# Patient Record
Sex: Male | Born: 1935 | Race: White | Hispanic: No | Smoking: Never smoker
Health system: Southern US, Community
[De-identification: ages and names within clinical notes are randomized; demographics above are authoritative.]

## PROBLEM LIST (undated history)

## (undated) DIAGNOSIS — I10 Essential (primary) hypertension: Secondary | ICD-10-CM

## (undated) DIAGNOSIS — M069 Rheumatoid arthritis, unspecified: Secondary | ICD-10-CM

## (undated) DIAGNOSIS — F0390 Unspecified dementia without behavioral disturbance: Secondary | ICD-10-CM

## (undated) DIAGNOSIS — G309 Alzheimer's disease, unspecified: Secondary | ICD-10-CM

## (undated) DIAGNOSIS — F0280 Dementia in other diseases classified elsewhere without behavioral disturbance: Secondary | ICD-10-CM

## (undated) DIAGNOSIS — I2699 Other pulmonary embolism without acute cor pulmonale: Secondary | ICD-10-CM

## (undated) DIAGNOSIS — F325 Major depressive disorder, single episode, in full remission: Secondary | ICD-10-CM

## (undated) HISTORY — DX: Essential (primary) hypertension: I10

## (undated) HISTORY — DX: Major depressive disorder, single episode, in full remission: F32.5

## (undated) HISTORY — DX: Unspecified dementia without behavioral disturbance: F03.90

## (undated) HISTORY — DX: Other pulmonary embolism without acute cor pulmonale: I26.99

## (undated) HISTORY — PX: JOINT REPLACEMENT: SHX530

---

## 2016-09-12 DEATH — deceased

## 2016-09-19 DIAGNOSIS — W19XXXA Unspecified fall, initial encounter: Secondary | ICD-10-CM

## 2016-09-19 DIAGNOSIS — I10 Essential (primary) hypertension: Secondary | ICD-10-CM

## 2016-09-19 DIAGNOSIS — F0390 Unspecified dementia without behavioral disturbance: Secondary | ICD-10-CM

## 2016-09-19 DIAGNOSIS — S72142A Displaced intertrochanteric fracture of left femur, initial encounter for closed fracture: Secondary | ICD-10-CM | POA: Diagnosis not present

## 2016-09-20 DIAGNOSIS — I10 Essential (primary) hypertension: Secondary | ICD-10-CM | POA: Diagnosis not present

## 2016-09-20 DIAGNOSIS — S72142A Displaced intertrochanteric fracture of left femur, initial encounter for closed fracture: Secondary | ICD-10-CM | POA: Diagnosis not present

## 2016-09-20 DIAGNOSIS — F0390 Unspecified dementia without behavioral disturbance: Secondary | ICD-10-CM | POA: Diagnosis not present

## 2016-09-20 DIAGNOSIS — W19XXXA Unspecified fall, initial encounter: Secondary | ICD-10-CM | POA: Diagnosis not present

## 2016-09-21 DIAGNOSIS — I10 Essential (primary) hypertension: Secondary | ICD-10-CM | POA: Diagnosis not present

## 2016-09-21 DIAGNOSIS — S72142A Displaced intertrochanteric fracture of left femur, initial encounter for closed fracture: Secondary | ICD-10-CM | POA: Diagnosis not present

## 2016-09-21 DIAGNOSIS — W19XXXA Unspecified fall, initial encounter: Secondary | ICD-10-CM | POA: Diagnosis not present

## 2016-09-21 DIAGNOSIS — F0390 Unspecified dementia without behavioral disturbance: Secondary | ICD-10-CM | POA: Diagnosis not present

## 2016-09-22 DIAGNOSIS — S72142A Displaced intertrochanteric fracture of left femur, initial encounter for closed fracture: Secondary | ICD-10-CM | POA: Diagnosis not present

## 2016-09-22 DIAGNOSIS — W19XXXA Unspecified fall, initial encounter: Secondary | ICD-10-CM

## 2016-09-22 DIAGNOSIS — F0390 Unspecified dementia without behavioral disturbance: Secondary | ICD-10-CM

## 2016-09-22 DIAGNOSIS — I10 Essential (primary) hypertension: Secondary | ICD-10-CM

## 2016-10-30 ENCOUNTER — Inpatient Hospital Stay (HOSPITAL_COMMUNITY)
Admission: EM | Admit: 2016-10-30 | Discharge: 2016-11-02 | DRG: 176 | Disposition: A | Discharge status: Skilled Nursing Facility | Payer: Medicare Other | Attending: Internal Medicine | Admitting: Internal Medicine

## 2016-10-30 ENCOUNTER — Emergency Department (HOSPITAL_COMMUNITY): Payer: Medicare Other

## 2016-10-30 ENCOUNTER — Encounter (HOSPITAL_COMMUNITY): Payer: Self-pay | Admitting: Emergency Medicine

## 2016-10-30 DIAGNOSIS — M069 Rheumatoid arthritis, unspecified: Secondary | ICD-10-CM | POA: Diagnosis present

## 2016-10-30 DIAGNOSIS — I2510 Atherosclerotic heart disease of native coronary artery without angina pectoris: Secondary | ICD-10-CM | POA: Diagnosis present

## 2016-10-30 DIAGNOSIS — I2699 Other pulmonary embolism without acute cor pulmonale: Principal | ICD-10-CM | POA: Diagnosis present

## 2016-10-30 DIAGNOSIS — R918 Other nonspecific abnormal finding of lung field: Secondary | ICD-10-CM | POA: Diagnosis present

## 2016-10-30 DIAGNOSIS — D696 Thrombocytopenia, unspecified: Secondary | ICD-10-CM | POA: Diagnosis present

## 2016-10-30 DIAGNOSIS — R0602 Shortness of breath: Secondary | ICD-10-CM | POA: Diagnosis present

## 2016-10-30 DIAGNOSIS — R296 Repeated falls: Secondary | ICD-10-CM | POA: Diagnosis present

## 2016-10-30 DIAGNOSIS — F0280 Dementia in other diseases classified elsewhere without behavioral disturbance: Secondary | ICD-10-CM | POA: Diagnosis present

## 2016-10-30 DIAGNOSIS — Z9181 History of falling: Secondary | ICD-10-CM

## 2016-10-30 DIAGNOSIS — I2609 Other pulmonary embolism with acute cor pulmonale: Secondary | ICD-10-CM

## 2016-10-30 DIAGNOSIS — Z96642 Presence of left artificial hip joint: Secondary | ICD-10-CM | POA: Diagnosis present

## 2016-10-30 DIAGNOSIS — R6510 Systemic inflammatory response syndrome (SIRS) of non-infectious origin without acute organ dysfunction: Secondary | ICD-10-CM | POA: Diagnosis present

## 2016-10-30 DIAGNOSIS — E872 Acidosis: Secondary | ICD-10-CM | POA: Diagnosis present

## 2016-10-30 DIAGNOSIS — Z7901 Long term (current) use of anticoagulants: Secondary | ICD-10-CM

## 2016-10-30 DIAGNOSIS — G309 Alzheimer's disease, unspecified: Secondary | ICD-10-CM | POA: Diagnosis not present

## 2016-10-30 DIAGNOSIS — Z66 Do not resuscitate: Secondary | ICD-10-CM | POA: Diagnosis present

## 2016-10-30 DIAGNOSIS — I10 Essential (primary) hypertension: Secondary | ICD-10-CM | POA: Diagnosis not present

## 2016-10-30 DIAGNOSIS — I2782 Chronic pulmonary embolism: Secondary | ICD-10-CM | POA: Diagnosis present

## 2016-10-30 HISTORY — DX: Alzheimer's disease, unspecified: G30.9

## 2016-10-30 HISTORY — DX: Essential (primary) hypertension: I10

## 2016-10-30 HISTORY — DX: Rheumatoid arthritis, unspecified: M06.9

## 2016-10-30 HISTORY — DX: Dementia in other diseases classified elsewhere, unspecified severity, without behavioral disturbance, psychotic disturbance, mood disturbance, and anxiety: F02.80

## 2016-10-30 LAB — CBC WITH DIFFERENTIAL/PLATELET
BASOS ABS: 0 10*3/uL (ref 0.0–0.1)
BASOS PCT: 0 %
EOS ABS: 0.1 10*3/uL (ref 0.0–0.7)
EOS PCT: 1 %
HEMATOCRIT: 44.7 % (ref 39.0–52.0)
Hemoglobin: 14.6 g/dL (ref 13.0–17.0)
Lymphocytes Relative: 6 %
Lymphs Abs: 0.8 10*3/uL (ref 0.7–4.0)
MCH: 28.5 pg (ref 26.0–34.0)
MCHC: 32.7 g/dL (ref 30.0–36.0)
MCV: 87.1 fL (ref 78.0–100.0)
MONO ABS: 0.8 10*3/uL (ref 0.1–1.0)
Monocytes Relative: 6 %
NEUTROS ABS: 11.1 10*3/uL — AB (ref 1.7–7.7)
Neutrophils Relative %: 87 %
PLATELETS: 166 10*3/uL (ref 150–400)
RBC: 5.13 MIL/uL (ref 4.22–5.81)
RDW: 14.7 % (ref 11.5–15.5)
WBC: 12.8 10*3/uL — ABNORMAL HIGH (ref 4.0–10.5)

## 2016-10-30 LAB — APTT: APTT: 91 s — AB (ref 24–36)

## 2016-10-30 LAB — I-STAT TROPONIN, ED: TROPONIN I, POC: 0.73 ng/mL — AB (ref 0.00–0.08)

## 2016-10-30 LAB — COMPREHENSIVE METABOLIC PANEL
ALBUMIN: 3.6 g/dL (ref 3.5–5.0)
ALT: 47 U/L (ref 17–63)
ANION GAP: 10 (ref 5–15)
AST: 30 U/L (ref 15–41)
Alkaline Phosphatase: 115 U/L (ref 38–126)
BUN: 17 mg/dL (ref 6–20)
CHLORIDE: 102 mmol/L (ref 101–111)
CO2: 24 mmol/L (ref 22–32)
Calcium: 9 mg/dL (ref 8.9–10.3)
Creatinine, Ser: 0.92 mg/dL (ref 0.61–1.24)
GFR calc Af Amer: >60 mL/min (ref >60)
GFR calc non Af Amer: >60 mL/min (ref >60)
GLUCOSE: 167 mg/dL — AB (ref 65–99)
POTASSIUM: 4.1 mmol/L (ref 3.5–5.1)
SODIUM: 136 mmol/L (ref 135–145)
Total Bilirubin: 0.7 mg/dL (ref 0.3–1.2)
Total Protein: 7.1 g/dL (ref 6.5–8.1)

## 2016-10-30 LAB — URINALYSIS, ROUTINE W REFLEX MICROSCOPIC
Bilirubin Urine: NEGATIVE
Glucose, UA: NEGATIVE mg/dL
HGB URINE DIPSTICK: NEGATIVE
Ketones, ur: NEGATIVE mg/dL
LEUKOCYTES UA: NEGATIVE
Nitrite: NEGATIVE
Protein, ur: NEGATIVE mg/dL
SPECIFIC GRAVITY, URINE: 1.011 (ref 1.005–1.030)
pH: 5.5 (ref 5.0–8.0)

## 2016-10-30 LAB — I-STAT CG4 LACTIC ACID, ED
LACTIC ACID, VENOUS: 2.31 mmol/L — AB (ref 0.5–1.9)
Lactic Acid, Venous: 1.25 mmol/L (ref 0.5–1.9)

## 2016-10-30 LAB — MRSA PCR SCREENING: MRSA by PCR: NEGATIVE

## 2016-10-30 LAB — TROPONIN I: TROPONIN I: 1.26 ng/mL — AB (ref <0.03)

## 2016-10-30 LAB — BRAIN NATRIURETIC PEPTIDE: B Natriuretic Peptide: 20.2 pg/mL (ref 0.0–100.0)

## 2016-10-30 MED ORDER — SODIUM CHLORIDE 0.9% FLUSH
3.0000 mL | Freq: Two times a day (BID) | INTRAVENOUS | Status: DC
  Administered 2016-10-31 – 2016-11-01 (×2): 3 mL via INTRAVENOUS

## 2016-10-30 MED ORDER — HEPARIN BOLUS VIA INFUSION
2000.0000 [IU] | Freq: Once | INTRAVENOUS | Status: AC
  Administered 2016-10-30: 2000 [IU] via INTRAVENOUS
  Filled 2016-10-30: qty 2000

## 2016-10-30 MED ORDER — IOPAMIDOL (ISOVUE-370) INJECTION 76%
INTRAVENOUS | Status: AC
  Filled 2016-10-30: qty 100

## 2016-10-30 MED ORDER — DONEPEZIL HCL 10 MG PO TABS
10.0000 mg | ORAL_TABLET | Freq: Every day | ORAL | Status: DC
  Administered 2016-10-31 – 2016-11-02 (×3): 10 mg via ORAL
  Filled 2016-10-30 (×3): qty 1

## 2016-10-30 MED ORDER — FOLIC ACID 1 MG PO TABS
1.0000 mg | ORAL_TABLET | Freq: Every day | ORAL | Status: DC
  Administered 2016-10-31 – 2016-11-02 (×3): 1 mg via ORAL
  Filled 2016-10-30 (×3): qty 1

## 2016-10-30 MED ORDER — CALCIUM CARBONATE-VITAMIN D 500-200 MG-UNIT PO TABS
1.0000 | ORAL_TABLET | Freq: Two times a day (BID) | ORAL | Status: DC
  Administered 2016-10-31 – 2016-11-02 (×5): 1 via ORAL
  Filled 2016-10-30 (×5): qty 1

## 2016-10-30 MED ORDER — SODIUM CHLORIDE 0.9 % IV BOLUS (SEPSIS)
500.0000 mL | Freq: Once | INTRAVENOUS | Status: AC
  Administered 2016-10-30: 500 mL via INTRAVENOUS

## 2016-10-30 MED ORDER — CITALOPRAM HYDROBROMIDE 20 MG PO TABS
10.0000 mg | ORAL_TABLET | Freq: Every day | ORAL | Status: DC
  Administered 2016-10-31 – 2016-11-02 (×3): 10 mg via ORAL
  Filled 2016-10-30 (×3): qty 1

## 2016-10-30 MED ORDER — ORAL CARE MOUTH RINSE
15.0000 mL | Freq: Two times a day (BID) | OROMUCOSAL | Status: DC
  Administered 2016-10-31 – 2016-11-02 (×5): 15 mL via OROMUCOSAL

## 2016-10-30 MED ORDER — METHOTREXATE 2.5 MG PO TABS
12.5000 mg | ORAL_TABLET | ORAL | Status: DC
  Administered 2016-11-02: 12.5 mg via ORAL
  Filled 2016-10-30 (×2): qty 5

## 2016-10-30 MED ORDER — METHOTREXATE 2.5 MG PO TABS: 12.5000 mg | ORAL_TABLET | ORAL | Status: DC

## 2016-10-30 MED ORDER — HEPARIN (PORCINE) IN NACL 100-0.45 UNIT/ML-% IJ SOLN
1200.0000 [IU]/h | INTRAMUSCULAR | Status: DC
  Administered 2016-10-30: 1200 [IU]/h via INTRAVENOUS
  Filled 2016-10-30 (×2): qty 250

## 2016-10-30 NOTE — Consult Note (Signed)
Name: Joseph [name] MRN: [id number] DOB: 1929/03/19    ADMISSION DATE:  10/30/2016 CONSULTATION DATE:  10/30/2016  REFERRING MD :  Julian Reil  CHIEF COMPLAINT:  Pulmonary embolism  BRIEF PATIENT DESCRIPTION: 45M with hx of dementia, HTN and RA presents with pulmonary embolism, mild troponin leak, adequate saturations on 2L [place], concern for RH strain by CT criteria.   SIGNIFICANT EVENTS   STUDIES:  CT PE 10/30/16 IMPRESSION: 1. Positive for acute PE with CT evidence of right heart strain (RV/LV Ratio = 1.9) consistent with at least submassive (intermediate risk) PE. The presence of right heart strain has been associated with an increased risk of morbidity and mortality. Please activate Code PE by paging [phone]. Critical Value/emergent results were called by telephone at the time of interpretation on 10/30/2016 at 6:05 pm to Dr. Crista Curb , who verbally acknowledged these results. 2. Aortic atherosclerosis (ICD10-170.0). Coronary artery calcification. 3. Scattered pulmonary nodules measure 4 mm or less in size. No follow-up needed if patient is low-risk (and has no known or suspected primary neoplasm). Non-contrast chest C in T can be considered in 12 months if patient is high-risk.  CXR 10/30/16 IMPRESSION: Stable chronic elevation of left hemidiaphragm. No active lung disease. Aortic atherosclerosis.   HISTORY OF PRESENT ILLNESS:   Joseph [name] is an 44M with PMH significant for dementia (moderately advanced), hypertension and rheumatoid arthritis, who presents from home via EMS with complaints of shortness of breath. He had a recent hip fracture (09/19/16), with markedly decreased mobility for the past several weeks. He was transitioning from wheelchair to walker and became acutely short of breath. His daughter had him sit back down to catch his breath. When she went to check on him a bit later, he was still very short of breath and said he needed to use the bathroom. He  continued to be short of breath and reportedly got pale while EMS was being called. Imaging in the ED revealed bilateral pulmonary emboli in the distal right and left main pulmonary arteries. RV/LV ratio 1.9 with mild troponin leak, normal BNP and no reports of hypotension, syncope or near-syncope. The patient was started on a heparin gtt and admitted to stepdown. His daughter provides most of the history. He does not feel short of breath while at rest and is lying comfortably in the bed. No chest pain with deep inspiration. No palpitations. No cough / sputum / hemoptysis. No nausea or vomiting. No lower extremity edema or asymmetry to speak of. ROS otherwise difficult to obtain 2/2 hx of dementia.   PAST MEDICAL HISTORY :   has a past medical history of Alzheimer's dementia; Hypertension; and RA (rheumatoid arthritis) (HCC).  has a past surgical history that includes Joint replacement. Prior to Admission medications   Medication Sig Start Date End Date Taking? Authorizing Provider  calcium-vitamin D (OSCAL WITH D) 500-200 MG-UNIT tablet Take 1 tablet by mouth 2 (two) times daily.   Yes Historical Provider, MD  citalopram (CELEXA) 10 MG tablet Take 10 mg by mouth daily.   Yes Historical Provider, MD  cyanocobalamin 1000 MCG tablet Take 1,000 mcg by mouth daily.   Yes Historical Provider, MD  donepezil (ARICEPT) 10 MG tablet Take 10 mg by mouth daily.   Yes Historical Provider, MD  folic acid (FOLVITE) 1 MG tablet Take 1 mg by mouth daily.   Yes Historical Provider, MD  losartan (COZAAR) 100 MG tablet Take 100 mg by mouth daily.   Yes Historical Provider, MD  methotrexate (  RHEUMATREX) 2.5 MG tablet Take 12.5 mg by mouth once a week. Caution:Chemotherapy. Protect from light.  Pt takes on Saturday evenings   Yes Historical Provider, MD   Allergies  Allergen Reactions  . Penicillins Other (See Comments)    Family thinks pt is allergic to penicillin but not sure     FAMILY HISTORY:  family history  includes Other in his father. SOCIAL HISTORY:  reports that he has never smoked. He has never used smokeless tobacco. He reports that he does not drink alcohol or use drugs.  REVIEW OF SYSTEMS:   Difficult to obtain secondary to dementia history; pertinent positives and negatives as reported in the HPI  SUBJECTIVE:   VITAL SIGNS: Temp:  [97.7 F (36.5 C)] 97.7 F (36.5 C) (11/18 1456) Pulse Rate:  [110-115] 110 (11/18 2030) Resp:  [16-21] 19 (11/18 2030) BP: (123-135)/(84-89) 127/88 (11/18 2030) SpO2:  [95 %-100 %] 98 % (11/18 2030) Weight:  [77.1 kg (170 lb)] 77.1 kg (170 lb) (11/18 1513)  PHYSICAL EXAMINATION:  General Well nourished, well developed, no apparent distress, resting comfortably  HEENT No gross abnormalities. Oropharynx clear.   Pulmonary Clear to auscultation bilaterally with no wheezes, rales or ronchi. Good effort, symmetrical expansion.   Cardiovascular Mild tachycardia 100s, regular rhythm. S1, s2. No m/r/g. Distal pulses palpable.  Abdomen Soft, non-tender, non-distended, positive bowel sounds, no palpable organomegaly or masses. Normoresonant to percussion.  Musculoskeletal Grossly normal  Lymphatics No cervical, supraclavicular or axillary adenopathy.   Neurologic Grossly intact. No focal deficits.   Skin/Integuement No rash, no cyanosis, no clubbing.       Recent Labs Lab 10/30/16 1504  NA 136  K 4.1  CL 102  CO2 24  BUN 17  CREATININE 0.92  GLUCOSE 167*    Recent Labs Lab 10/30/16 1504  HGB 14.6  HCT 44.7  WBC 12.8*  PLT 166   Dg Chest 2 View  Result Date: 10/30/2016 CLINICAL DATA:  Acute onset of shortness of breath approximately 1 hour ago. EXAM: CHEST  2 VIEW COMPARISON:  10/15/2016 FINDINGS: Heart size is within normal limits.  Aortic atherosclerosis. Chronic elevation of left hemidiaphragm is stable. Both lungs are clear. No evidence of pneumothorax or pleural effusion. IMPRESSION: Stable chronic elevation of left hemidiaphragm.  No active lung disease. Aortic atherosclerosis. Electronically Signed   By: Myles Rosenthal M.D.   On: 10/30/2016 15:52   Ct Angio Chest Pe W And/or Wo Contrast  Result Date: 10/30/2016 CLINICAL DATA:  Sub onset shortness of breath. EXAM: CT ANGIOGRAPHY CHEST WITH CONTRAST TECHNIQUE: Multidetector CT imaging of the chest was performed using the standard protocol during bolus administration of intravenous contrast. Multiplanar CT image reconstructions and MIPs were obtained to evaluate the vascular anatomy. CONTRAST:  100 cc Isovue 370. COMPARISON:  None. FINDINGS: Cardiovascular: There are filling defects in the pulmonary arteries bilaterally with the most proximal clot is seen in the distal right and left main pulmonary arteries. RV/LV ratio is 1.9. Atherosclerotic calcification of the arterial vasculature, including coronary arteries. Heart size normal. No pericardial effusion. Mediastinum/Nodes: No pathologically enlarged mediastinal, hilar or axillary lymph nodes. Esophagus is grossly unremarkable. Lungs/Pleura: Image quality is degraded by respiratory motion. A few scattered pulmonary nodules measure 4 mm or less in size. Mild volume loss of the base of the left hemi thorax, adjacent to an elevated left hemidiaphragm. No pleural fluid. Airway is unremarkable. Upper Abdomen: Sub cm low-attenuation lesion in the left hepatic lobe is too small to characterize. Visualized portions  of the liver, gallbladder, adrenal glands, left kidney, spleen, pancreas, stomach and bowel are otherwise grossly unremarkable. Musculoskeletal: No worrisome lytic or sclerotic lesions. Degenerative changes are seen in the spine. Review of the MIP images confirms the above findings. IMPRESSION: 1. Positive for acute PE with CT evidence of right heart strain (RV/LV Ratio = 1.9) consistent with at least submassive (intermediate risk) PE. The presence of right heart strain has been associated with an increased risk of morbidity and  mortality. Please activate Code PE by paging [phone]. Critical Value/emergent results were called by telephone at the time of interpretation on 10/30/2016 at 6:05 pm to Dr. Crista Curb , who verbally acknowledged these results. 2. Aortic atherosclerosis (ICD10-170.0). Coronary artery calcification. 3. Scattered pulmonary nodules measure 4 mm or less in size. No follow-up needed if patient is low-risk (and has no known or suspected primary neoplasm). Non-contrast chest C in T can be considered in 12 months if patient is high-risk. This recommendation follows the consensus statement: Guidelines for Management of Incidental Pulmonary Nodules Detected on CT Images: From the Fleischner Society 2017; Radiology 2017; 284:228-243. Electronically Signed   By: Leanna Battles M.D.   On: 10/30/2016 18:10    ASSESSMENT / PLAN:  Joseph [name] is an 17M with what appears to be a provoked DVT/PE related to recent hip surgery and fall. He has no history of clotting disorders and no other identifiable risk factors (no history of malignancy). While his RV/LV ratio was increased on his initial CT scan, his clinical picture with lack of hypotension/syncope, normal BNP, mild tachycardia and minimal oxygen requirements suggests he has not truly suffered a submassive PE. Given his age and dementia, I would not recommend TPA or directed thrombolysis. Continue anticoagulation. He is likely a better candidate for heparin/LMWH -> warfarin than a NOAC given his fall risk. O2 should be weaned to maintain sats >90%. He would benefit from early PT. He should have a walk for desat prior to discharge.   1) Acute bilateral pulmonary emboli   continue anticoagulation with heparin and transition to oral agent as able  should he deteriorate clinically, could discuss catheter directed thrombolysis, although he is high risk for any tPA exposure  Wean O2 as tolerated for sats >90%  Walk for desat prior to discharge  2) Code  status  During my interview, his daughter produced a portable DNR; this order has been placed.  Thank you for the consult.   Nita Sickle, MD Pulmonary and Critical Care Medicine [facility] Pager: [phone]  10/30/2016, 8:52 PM

## 2016-10-30 NOTE — ED Notes (Addendum)
Hospitalist Gardner,MD. at bedside.

## 2016-10-30 NOTE — ED Provider Notes (Signed)
[facility]-EMERGENCY DEPT Provider Note   CSN: [id number] Arrival date & time: 10/30/16  1440     History   Chief Complaint Chief Complaint  Patient presents with  . Shortness of Breath    HPI Joseph [name] is a 82 y.o. male.  HPI Level V caveat due to dementia. He has history of Alzheimer's dementia, HTN, and rheumatoid arthritis. Per EMS, called to home where he lives to family. He had 2 episodes of dyspnea, lasting 3-5 minutes, and subsequently resolved. In ED, denies chest pain, dyspnea, cough.   Per daughter who lives with patient at home, he had repair of left hip fracture in October of this year and has been less mobile. States that he has been in his usual state of health up until today. While transferring from chair to wheelchair today to go to the bathroom he appeared very winded and complained of shortness this of breath. Resolved after a few minutes. Similarly while sitting on the toilet started breathing heavily for several minutes, and then had resolution of symptoms. EMS was subsequently called and when patient transferred from wheelchair to the gurney, he again looked very short of breath and winded. He has not had fevers, cough complaints of chest pain, lower extremity edema, nausea or vomiting, or diarrhea.  Past Medical History:  Diagnosis Date  . Alzheimer's dementia   . Hypertension   . RA (rheumatoid arthritis) [facility](HCC)     Patient Active Problem List   Diagnosis Date Noted  . Pulmonary embolism (HCC) 10/30/2016  . Pulmonary nodules 10/30/2016  . HTN (hypertension) 10/30/2016  . Alzheimer's dementia 10/30/2016  . RA (rheumatoid arthritis) (HCC) 10/30/2016    Past Surgical History:  Procedure Laterality Date  . JOINT REPLACEMENT     left hip replacement 09-19-16       Home Medications    Prior to Admission medications   Medication Sig Start Date End Date Taking? Authorizing Provider  calcium-vitamin D (OSCAL WITH D) 500-200 MG-UNIT tablet Take 1 tablet  by mouth 2 (two) times daily.   Yes Historical Provider, MD  citalopram (CELEXA) 10 MG tablet Take 10 mg by mouth daily.   Yes Historical Provider, MD  cyanocobalamin 1000 MCG tablet Take 1,000 mcg by mouth daily.   Yes Historical Provider, MD  donepezil (ARICEPT) 10 MG tablet Take 10 mg by mouth daily.   Yes Historical Provider, MD  folic acid (FOLVITE) 1 MG tablet Take 1 mg by mouth daily.   Yes Historical Provider, MD  losartan (COZAAR) 100 MG tablet Take 100 mg by mouth daily.   Yes Historical Provider, MD  methotrexate (RHEUMATREX) 2.5 MG tablet Take 12.5 mg by mouth once a week. Caution:Chemotherapy. Protect from light.  Pt takes on Saturday evenings   Yes Historical Provider, MD    Family History Family History  Problem Relation Age of Onset  . Other Father     On coumadin    Social History Social History  Substance Use Topics  . Smoking status: Never Smoker  . Smokeless tobacco: Never Used  . Alcohol use No     Allergies   Penicillins   Review of Systems Review of Systems Unable to be obtained due to dementia  Physical Exam Updated Vital Signs BP 127/88   Pulse 110   Temp 97.7 F (36.5 C) (Oral)   Resp 19   Ht 5\' 7"  (1.702 m)   Wt 170 lb (77.1 kg)   SpO2 98%   BMI 26.63 kg/m   Physical  Exam Physical Exam  Nursing note and vitals reviewed. Constitutional: Well developed, well nourished, non-toxic, and in no acute distress Head: Normocephalic and atraumatic.  Mouth/Throat: Oropharynx is clear and moist.  Neck: Normal range of motion. Neck supple.  Cardiovascular: tachycardic rate and regular rhythm.  No edema. Pulmonary/Chest: Effort normal and breath sounds normal.  Abdominal: Soft. There is no tenderness. There is no rebound and no guarding.  Musculoskeletal: Normal range of motion.  Neurological: Alert, oriented x 0, no facial droop, fluent speech, moves all extremities symmetrically Skin: Skin is warm and dry.  Psychiatric: Cooperative   ED  Treatments / Results  Labs (all labs ordered are listed, but only abnormal results are displayed) Labs Reviewed  CBC WITH DIFFERENTIAL/PLATELET - Abnormal; Notable for the following:       Result Value   WBC 12.8 (*)    Neutro Abs 11.1 (*)    All other components within normal limits  COMPREHENSIVE METABOLIC PANEL - Abnormal; Notable for the following:    Glucose, Bld 167 (*)    All other components within normal limits  APTT - Abnormal; Notable for the following:    aPTT 91 (*)    All other components within normal limits  I-STAT TROPOININ, ED - Abnormal; Notable for the following:    Troponin i, poc 0.73 (*)    All other components within normal limits  I-STAT CG4 LACTIC ACID, ED - Abnormal; Notable for the following:    Lactic Acid, Venous 2.31 (*)    All other components within normal limits  URINALYSIS, ROUTINE W REFLEX MICROSCOPIC (NOT AT [facility])  BRAIN NATRIURETIC PEPTIDE  HEPARIN LEVEL (UNFRACTIONATED)  CBC  BASIC METABOLIC PANEL  TROPONIN I  TROPONIN I  TROPONIN I  I-STAT CG4 LACTIC ACID, ED    EKG  EKG Interpretation  Date/Time:  Saturday October 30 2016 14:54:03 EST Ventricular Rate:  116 PR Interval:    QRS Duration: 90 QT Interval:  307 QTC Calculation: 427 R Axis:   76 Text Interpretation:  Sinus tachycardia Low voltage, precordial leads No prior EKG  Confirmed by Minh Roanhorse MD, Annabelle Harman ([id number]) on 10/30/2016 3:04:54 PM       Radiology Dg Chest 2 View  Result Date: 10/30/2016 CLINICAL DATA:  Acute onset of shortness of breath approximately 1 hour ago. EXAM: CHEST  2 VIEW COMPARISON:  10/15/2016 FINDINGS: Heart size is within normal limits.  Aortic atherosclerosis. Chronic elevation of left hemidiaphragm is stable. Both lungs are clear. No evidence of pneumothorax or pleural effusion. IMPRESSION: Stable chronic elevation of left hemidiaphragm. No active lung disease. Aortic atherosclerosis. Electronically Signed   By: Myles Rosenthal M.D.   On: 10/30/2016 15:52   Ct  Angio Chest Pe W And/or Wo Contrast  Result Date: 10/30/2016 CLINICAL DATA:  Sub onset shortness of breath. EXAM: CT ANGIOGRAPHY CHEST WITH CONTRAST TECHNIQUE: Multidetector CT imaging of the chest was performed using the standard protocol during bolus administration of intravenous contrast. Multiplanar CT image reconstructions and MIPs were obtained to evaluate the vascular anatomy. CONTRAST:  100 cc Isovue 370. COMPARISON:  None. FINDINGS: Cardiovascular: There are filling defects in the pulmonary arteries bilaterally with the most proximal clot is seen in the distal right and left main pulmonary arteries. RV/LV ratio is 1.9. Atherosclerotic calcification of the arterial vasculature, including coronary arteries. Heart size normal. No pericardial effusion. Mediastinum/Nodes: No pathologically enlarged mediastinal, hilar or axillary lymph nodes. Esophagus is grossly unremarkable. Lungs/Pleura: Image quality is degraded by respiratory motion. A few scattered pulmonary  nodules measure 4 mm or less in size. Mild volume loss of the base of the left hemi thorax, adjacent to an elevated left hemidiaphragm. No pleural fluid. Airway is unremarkable. Upper Abdomen: Sub cm low-attenuation lesion in the left hepatic lobe is too small to characterize. Visualized portions of the liver, gallbladder, adrenal glands, left kidney, spleen, pancreas, stomach and bowel are otherwise grossly unremarkable. Musculoskeletal: No worrisome lytic or sclerotic lesions. Degenerative changes are seen in the spine. Review of the MIP images confirms the above findings. IMPRESSION: 1. Positive for acute PE with CT evidence of right heart strain (RV/LV Ratio = 1.9) consistent with at least submassive (intermediate risk) PE. The presence of right heart strain has been associated with an increased risk of morbidity and mortality. Please activate Code PE by paging [phone]. Critical Value/emergent results were called by telephone at the time of  interpretation on 10/30/2016 at 6:05 pm to Dr. Crista Curb , who verbally acknowledged these results. 2. Aortic atherosclerosis (ICD10-170.0). Coronary artery calcification. 3. Scattered pulmonary nodules measure 4 mm or less in size. No follow-up needed if patient is low-risk (and has no known or suspected primary neoplasm). Non-contrast chest C in T can be considered in 12 months if patient is high-risk. This recommendation follows the consensus statement: Guidelines for Management of Incidental Pulmonary Nodules Detected on CT Images: From the Fleischner Society 2017; Radiology 2017; 284:228-243. Electronically Signed   By: Leanna Battles M.D.   On: 10/30/2016 18:10    Procedures Procedures (including critical care time) CRITICAL CARE Performed by: Lavera Guise   Total critical care time: 40 minutes  Critical care time was exclusive of separately billable procedures and treating other patients.  Critical care was necessary to treat or prevent imminent or life-threatening deterioration.  Critical care was time spent personally by me on the following activities: development of treatment plan with patient and/or surrogate as well as nursing, discussions with consultants, evaluation of patient's response to treatment, examination of patient, obtaining history from patient or surrogate, ordering and performing treatments and interventions, ordering and review of laboratory studies, ordering and review of radiographic studies, pulse oximetry and re-evaluation of patient's condition.  Medications Ordered in ED Medications  iopamidol (ISOVUE-370) 76 % injection (not administered)  heparin ADULT infusion 100 units/mL (25000 units/259mL sodium chloride 0.45%) (1,200 Units/hr Intravenous New Bag/Given 10/30/16 1845)  calcium-vitamin D (OSCAL WITH D) 500-200 MG-UNIT per tablet 1 tablet (not administered)  citalopram (CELEXA) tablet 10 mg (not administered)  donepezil (ARICEPT) tablet 10 mg (not  administered)  folic acid (FOLVITE) tablet 1 mg (not administered)  sodium chloride flush (NS) 0.9 % injection 3 mL (not administered)  methotrexate (RHEUMATREX) tablet 12.5 mg (not administered)  sodium chloride 0.9 % bolus 500 mL (0 mLs Intravenous Stopped 10/30/16 1720)  heparin bolus via infusion 2,000 Units (2,000 Units Intravenous Bolus from Bag 10/30/16 1845)     Initial Impression / Assessment and Plan / ED Course  I have reviewed the triage vital signs and the nursing notes.  Pertinent labs & imaging results that were available during my care of the patient were reviewed by me and considered in my medical decision making (see chart for details).  Clinical Course     History of also received dementia and recent left hip fracture with repair in October who presents with episodic shortness of breath over the course of the past day. On arrival he is tachycardic with heart rate in the 110s to 120s. He is normotensive, afebrile,  breathing comfortably with normal oxygenation. Chest x-ray visualized and shows no acute cardiopulmonary processes. He is noted to have troponin elevation of 0.7 and no complaints of chest pain and no acute ischemic changes noted on EKG. Concerned about PE, and underwent CT angiogram of the chest. This is visualized and reviewed with radiology. He has bilateral segmental PE causing right heart strain. I discussed with Dr. Levada Schilling from ICU who recommended hospitalist admission to stepdown unit with pulmonary critical care consultation. Subsequently discussed with Dr. Julian Reil who will admit to stepdown. In interim, patient started on heparin drip.    Final Clinical Impressions(s) / ED Diagnoses   Final diagnoses:  Other acute pulmonary embolism with acute cor pulmonale (HCC)    New Prescriptions New Prescriptions   No medications on file     Lavera Guise, MD 10/30/16 2116

## 2016-10-30 NOTE — ED Notes (Signed)
Sent add on label for PTT/BNP -  Scientific laboratory technician is locked so I am unable to click off labels and send them.  Per lab - send down sticker with chart label and hand write labs that were added on.

## 2016-10-30 NOTE — ED Notes (Signed)
Patient returned from X-ray 

## 2016-10-30 NOTE — Progress Notes (Signed)
ANTICOAGULATION CONSULT NOTE - Initial Consult  Pharmacy Consult for IV heparin Indication: pulmonary embolus  Allergies  Allergen Reactions  . Penicillins Other (See Comments)    Family thinks pt is allergic to penicillin but not sure     Patient Measurements: Height: 5\' 7"  (170.2 cm) Weight: 170 lb (77.1 kg) IBW/kg (Calculated) : 66.1 Heparin Dosing Weight: 77  Vital Signs: Temp: 97.7 F (36.5 C) (11/18 1456) Temp Source: Oral (11/18 1456) BP: 135/89 (11/18 1723) Pulse Rate: 115 (11/18 1723)  Labs:  Recent Labs  10/30/16 1504  HGB 14.6  HCT 44.7  PLT 166  CREATININE 0.92    Estimated Creatinine Clearance: 54.9 mL/min (by C-G formula based on SCr of 0.92 mg/dL).   Medical History: Past Medical History:  Diagnosis Date  . Arthritis   . Hypertension     Medications:  Scheduled:  . iopamidol       Infusions:    Assessment: 82 yo male presented to ER with shortness of breath found to have new PE to start IV heparin per pharmacy dosing. Baseline labs WNL.   Goal of Therapy:  Heparin level 0.3-0.7 units/ml Monitor platelets by anticoagulation protocol: Yes   Plan:  1) Start IV heparin 2000 unit bolus then  2) IV heparin rate of 1200 units/hr 3) Note that IV heparin is not treatment of choice for PE per guidelines - recommend switching as soon as possible 4) Check heparin level 8 hours after start of IV heparin 5) Daily heparin level and CBC   83, PharmD, BCPS Pager [phone] 10/30/2016 6:18 PM

## 2016-10-30 NOTE — ED Notes (Signed)
Patient transported to CT 

## 2016-10-30 NOTE — ED Triage Notes (Signed)
Pt from home via [facility]- Pt has sudden onset of SOB approx 1 hr PTA. Pt sts that he had another instance of SOB after calling EMS while using the restroom. While en route and upon arrival, pt sts he feels better and is no longer SOB. Pt is A&O and in NAD. Pt showed ST on monitor en route as well.

## 2016-10-30 NOTE — H&P (Signed)
History and Physical    Joseph [name] OFB:[id number] DOB: 1928-12-21 DOA: 10/30/2016   PCP: Charlott Rakes, MD Chief Complaint:  Chief Complaint  Patient presents with  . Shortness of Breath    HPI: Joseph [name] is a 82 y.o. male with medical history significant of RA, HTN, alzheimer's dementia.  Presents to ED today after family (daughter who is historian) noted 2 episodes of dyspnea that onset after he stood up this afternoon.  Each lasting 3-5 mins and subsequently resolved.  No cough, fever.  No similar symptoms previously.  Tried nothing for symptoms, nothing makes better or worse.  ED Course: Tachy to 110s, CTA PE study demonstrates PEs with R heart strain.  Trop 0.73.  Review of Systems: As per HPI otherwise 10 point review of systems negative.    Past Medical History:  Diagnosis Date  . Alzheimer's dementia   . Hypertension   . RA (rheumatoid arthritis) (HCC)     Past Surgical History:  Procedure Laterality Date  . JOINT REPLACEMENT     left hip replacement 09-19-16     reports that he has never smoked. He has never used smokeless tobacco. He reports that he does not drink alcohol or use drugs.  Allergies  Allergen Reactions  . Penicillins Other (See Comments)    Family thinks pt is allergic to penicillin but not sure     Family History  Problem Relation Age of Onset  . Other Father     On coumadin      Prior to Admission medications   Medication Sig Start Date End Date Taking? Authorizing Provider  calcium-vitamin D (OSCAL WITH D) 500-200 MG-UNIT tablet Take 1 tablet by mouth 2 (two) times daily.   Yes Historical Provider, MD  citalopram (CELEXA) 10 MG tablet Take 10 mg by mouth daily.   Yes Historical Provider, MD  cyanocobalamin 1000 MCG tablet Take 1,000 mcg by mouth daily.   Yes Historical Provider, MD  donepezil (ARICEPT) 10 MG tablet Take 10 mg by mouth daily.   Yes Historical Provider, MD  folic acid (FOLVITE) 1 MG tablet Take 1 mg by mouth daily.    Yes Historical Provider, MD  losartan (COZAAR) 100 MG tablet Take 100 mg by mouth daily.   Yes Historical Provider, MD  methotrexate (RHEUMATREX) 2.5 MG tablet Take 12.5 mg by mouth once a week. Caution:Chemotherapy. Protect from light.  Pt takes on Saturday evenings   Yes Historical Provider, MD    Physical Exam: Vitals:   10/30/16 1456 10/30/16 1513 10/30/16 1530 10/30/16 1723  BP: 134/84  133/86 135/89  Pulse: 115  114 115  Resp: 18  19 16   Temp: 97.7 F (36.5 C)     TempSrc: Oral     SpO2: 100% 97% 95% 95%  Weight:  77.1 kg (170 lb)    Height:  5\' 7"  (1.702 m)        Constitutional: NAD, calm, comfortable Eyes: PERRL, lids and conjunctivae normal ENMT: Mucous membranes are moist. Posterior pharynx clear of any exudate or lesions.Normal dentition.  Neck: normal, supple, no masses, no thyromegaly Respiratory: clear to auscultation bilaterally, no wheezing, no crackles. Normal respiratory effort. No accessory muscle use.  Cardiovascular: Regular rate and rhythm, no murmurs / rubs / gallops. No extremity edema. 2+ pedal pulses. No carotid bruits.  Abdomen: no tenderness, no masses palpated. No hepatosplenomegaly. Bowel sounds positive.  Musculoskeletal: no clubbing / cyanosis. No joint deformity upper and lower extremities. Good ROM, no contractures. Normal muscle tone.  Skin: Lesion on superior posterior aspect of R ear, suspicious for a small primary skin CA. Neurologic: CN 2-12 grossly intact. Sensation intact, DTR normal. Strength 5/5 in all 4.  Psychiatric: Normal judgment and insight. Alert and oriented x 3. Normal mood.    Labs on Admission: I have personally reviewed following labs and imaging studies  CBC:  Recent Labs Lab 10/30/16 1504  WBC 12.8*  NEUTROABS 11.1*  HGB 14.6  HCT 44.7  MCV 87.1  PLT 166   Basic Metabolic Panel:  Recent Labs Lab 10/30/16 1504  NA 136  K 4.1  CL 102  CO2 24  GLUCOSE 167*  BUN 17  CREATININE 0.92  CALCIUM 9.0    GFR: Estimated Creatinine Clearance: 54.9 mL/min (by C-G formula based on SCr of 0.92 mg/dL). Liver Function Tests:  Recent Labs Lab 10/30/16 1504  AST 30  ALT 47  ALKPHOS 115  BILITOT 0.7  PROT 7.1  ALBUMIN 3.6   No results for input(s): LIPASE, AMYLASE in the last 168 hours. No results for input(s): AMMONIA in the last 168 hours. Coagulation Profile: No results for input(s): INR, PROTIME in the last 168 hours. Cardiac Enzymes: No results for input(s): CKTOTAL, CKMB, CKMBINDEX, TROPONINI in the last 168 hours. BNP (last 3 results) No results for input(s): PROBNP in the last 8760 hours. HbA1C: No results for input(s): HGBA1C in the last 72 hours. CBG: No results for input(s): GLUCAP in the last 168 hours. Lipid Profile: No results for input(s): CHOL, HDL, LDLCALC, TRIG, CHOLHDL, LDLDIRECT in the last 72 hours. Thyroid Function Tests: No results for input(s): TSH, T4TOTAL, FREET4, T3FREE, THYROIDAB in the last 72 hours. Anemia Panel: No results for input(s): VITAMINB12, FOLATE, FERRITIN, TIBC, IRON, RETICCTPCT in the last 72 hours. Urine analysis:    Component Value Date/Time   COLORURINE YELLOW 10/30/2016 1630   APPEARANCEUR CLEAR 10/30/2016 1630   LABSPEC 1.011 10/30/2016 1630   PHURINE 5.5 10/30/2016 1630   GLUCOSEU NEGATIVE 10/30/2016 1630   HGBUR NEGATIVE 10/30/2016 1630   BILIRUBINUR NEGATIVE 10/30/2016 1630   KETONESUR NEGATIVE 10/30/2016 1630   PROTEINUR NEGATIVE 10/30/2016 1630   NITRITE NEGATIVE 10/30/2016 1630   LEUKOCYTESUR NEGATIVE 10/30/2016 1630   Sepsis Labs: @LABRCNTIP (procalcitonin:4,lacticidven:4) )No results found for this or any previous visit (from the past 240 hour(s)).   Radiological Exams on Admission: Dg Chest 2 View  Result Date: 10/30/2016 CLINICAL DATA:  Acute onset of shortness of breath approximately 1 hour ago. EXAM: CHEST  2 VIEW COMPARISON:  10/15/2016 FINDINGS: Heart size is within normal limits.  Aortic atherosclerosis.  Chronic elevation of left hemidiaphragm is stable. Both lungs are clear. No evidence of pneumothorax or pleural effusion. IMPRESSION: Stable chronic elevation of left hemidiaphragm. No active lung disease. Aortic atherosclerosis. Electronically Signed   By: 13/02/2016 M.D.   On: 10/30/2016 15:52   Ct Angio Chest Pe W And/or Wo Contrast  Result Date: 10/30/2016 CLINICAL DATA:  Sub onset shortness of breath. EXAM: CT ANGIOGRAPHY CHEST WITH CONTRAST TECHNIQUE: Multidetector CT imaging of the chest was performed using the standard protocol during bolus administration of intravenous contrast. Multiplanar CT image reconstructions and MIPs were obtained to evaluate the vascular anatomy. CONTRAST:  100 cc Isovue 370. COMPARISON:  None. FINDINGS: Cardiovascular: There are filling defects in the pulmonary arteries bilaterally with the most proximal clot is seen in the distal right and left main pulmonary arteries. RV/LV ratio is 1.9. Atherosclerotic calcification of the arterial vasculature, including coronary arteries. Heart size normal. No pericardial  effusion. Mediastinum/Nodes: No pathologically enlarged mediastinal, hilar or axillary lymph nodes. Esophagus is grossly unremarkable. Lungs/Pleura: Image quality is degraded by respiratory motion. A few scattered pulmonary nodules measure 4 mm or less in size. Mild volume loss of the base of the left hemi thorax, adjacent to an elevated left hemidiaphragm. No pleural fluid. Airway is unremarkable. Upper Abdomen: Sub cm low-attenuation lesion in the left hepatic lobe is too small to characterize. Visualized portions of the liver, gallbladder, adrenal glands, left kidney, spleen, pancreas, stomach and bowel are otherwise grossly unremarkable. Musculoskeletal: No worrisome lytic or sclerotic lesions. Degenerative changes are seen in the spine. Review of the MIP images confirms the above findings. IMPRESSION: 1. Positive for acute PE with CT evidence of right heart strain  (RV/LV Ratio = 1.9) consistent with at least submassive (intermediate risk) PE. The presence of right heart strain has been associated with an increased risk of morbidity and mortality. Please activate Code PE by paging [phone]. Critical Value/emergent results were called by telephone at the time of interpretation on 10/30/2016 at 6:05 pm to Dr. Crista CurbANA LIU , who verbally acknowledged these results. 2. Aortic atherosclerosis (ICD10-170.0). Coronary artery calcification. 3. Scattered pulmonary nodules measure 4 mm or less in size. No follow-up needed if patient is low-risk (and has no known or suspected primary neoplasm). Non-contrast chest C in T can be considered in 12 months if patient is high-risk. This recommendation follows the consensus statement: Guidelines for Management of Incidental Pulmonary Nodules Detected on CT Images: From the Fleischner Society 2017; Radiology 2017; 284:228-243. Electronically Signed   By: Leanna BattlesMelinda  Blietz M.D.   On: 10/30/2016 18:10    EKG: Independently reviewed.  Assessment/Plan Principal Problem:   Pulmonary embolism (HCC) Active Problems:   Pulmonary nodules   HTN (hypertension)   Alzheimer's dementia   RA (rheumatoid arthritis) (HCC)    1. PE - 1. Heparin gtt 2. Tele monitor 3. 2d echo 2. Pulmonary nodules - patient a never smoker, no prior h/o CA.  Most likely represent rheumatoid nodules given known h/o RA. 3. RA - takes MTX on tuesdays 4. HTN - Holding losartan for the moment given acute PE with R heart strain, BP 130s. 5. Alzheimer's dementia - 1. Continue aricept 2. Does have some increased fall risk, is working with PT and planning on moving to ALF starting next week.   DVT prophylaxis: heparin gtt Code Status: Full Family Communication: Daughter at bedside Consults called: None Admission status: Admit to inpatient   Hillary BowGARDNER, JARED M. DO [facility] Pager [phone] from 7PM-7AM  If 7AM-7PM, please contact the day physician for  the patient [email or web address] Password [email or web address]  10/30/2016, 8:30 PM

## 2016-10-30 NOTE — ED Notes (Signed)
Family at bedside. 

## 2016-10-31 ENCOUNTER — Inpatient Hospital Stay (HOSPITAL_COMMUNITY): Payer: Medicare Other

## 2016-10-31 DIAGNOSIS — F0280 Dementia in other diseases classified elsewhere without behavioral disturbance: Secondary | ICD-10-CM

## 2016-10-31 DIAGNOSIS — I2699 Other pulmonary embolism without acute cor pulmonale: Principal | ICD-10-CM

## 2016-10-31 DIAGNOSIS — G309 Alzheimer's disease, unspecified: Secondary | ICD-10-CM

## 2016-10-31 DIAGNOSIS — M069 Rheumatoid arthritis, unspecified: Secondary | ICD-10-CM

## 2016-10-31 DIAGNOSIS — I10 Essential (primary) hypertension: Secondary | ICD-10-CM

## 2016-10-31 DIAGNOSIS — R918 Other nonspecific abnormal finding of lung field: Secondary | ICD-10-CM

## 2016-10-31 LAB — BASIC METABOLIC PANEL
ANION GAP: 7 (ref 5–15)
BUN: 16 mg/dL (ref 6–20)
CO2: 24 mmol/L (ref 22–32)
Calcium: 8.7 mg/dL — ABNORMAL LOW (ref 8.9–10.3)
Chloride: 105 mmol/L (ref 101–111)
Creatinine, Ser: 0.91 mg/dL (ref 0.61–1.24)
GFR calc Af Amer: >60 mL/min (ref >60)
GLUCOSE: 133 mg/dL — AB (ref 65–99)
POTASSIUM: 4.3 mmol/L (ref 3.5–5.1)
Sodium: 136 mmol/L (ref 135–145)

## 2016-10-31 LAB — ECHOCARDIOGRAM COMPLETE
CHL CUP RV SYS PRESS: 43 mmHg
CHL CUP TV REG PEAK VELOCITY: 315 cm/s
E/e' ratio: 9.66
FS: 32 % (ref 28–44)
HEIGHTINCHES: 67 in
IV/PV OW: 0.97
LA vol A4C: 35.2 ml
LADIAMINDEX: 1.65 cm/m2
LASIZE: 31 mm
LAVOL: 40.3 mL
LAVOLIN: 21.4 mL/m2
LEFT ATRIUM END SYS DIAM: 31 mm
LV PW d: 11.9 mm — AB (ref 0.6–1.1)
LV TDI E'MEDIAL: 5.55
LV e' LATERAL: 6.53 cm/s
LVEEAVG: 9.66
LVEEMED: 9.66
LVOT area: 3.46 cm2
LVOT diameter: 21 mm
Lateral S' vel: 14.6 cm/s
MV pk E vel: 63.1 m/s
MVPKAVEL: 120 m/s
TAPSE: 15.6 mm
TDI e' lateral: 6.53
TRMAXVEL: 315 cm/s
WEIGHTICAEL: 2610.25 [oz_av]

## 2016-10-31 LAB — CBC
HCT: 41.1 % (ref 39.0–52.0)
Hemoglobin: 13.5 g/dL (ref 13.0–17.0)
MCH: 28.7 pg (ref 26.0–34.0)
MCHC: 32.8 g/dL (ref 30.0–36.0)
MCV: 87.3 fL (ref 78.0–100.0)
Platelets: 149 10*3/uL — ABNORMAL LOW (ref 150–400)
RBC: 4.71 MIL/uL (ref 4.22–5.81)
RDW: 14.7 % (ref 11.5–15.5)
WBC: 8.5 10*3/uL (ref 4.0–10.5)

## 2016-10-31 LAB — TROPONIN I
Troponin I: 0.65 ng/mL (ref <0.03)
Troponin I: 0.41 ng/mL (ref <0.03)

## 2016-10-31 LAB — HEPARIN LEVEL (UNFRACTIONATED)
HEPARIN UNFRACTIONATED: 0.68 [IU]/mL (ref 0.30–0.70)
HEPARIN UNFRACTIONATED: 0.51 [IU]/mL (ref 0.30–0.70)

## 2016-10-31 MED ORDER — HEPARIN (PORCINE) IN NACL 100-0.45 UNIT/ML-% IJ SOLN
1200.0000 [IU]/h | INTRAMUSCULAR | Status: AC
  Administered 2016-10-31 – 2016-11-01 (×3): 1200 [IU]/h via INTRAVENOUS
  Filled 2016-10-31 (×4): qty 250

## 2016-10-31 MED ORDER — ENOXAPARIN SODIUM 80 MG/0.8ML ~~LOC~~ SOLN
1.0000 mg/kg | Freq: Two times a day (BID) | SUBCUTANEOUS | Status: DC
  Filled 2016-10-31: qty 0.8

## 2016-10-31 NOTE — Progress Notes (Signed)
  Echocardiogram 2D Echocardiogram has been performed.  Joseph [name] 10/31/2016, 9:54 AM

## 2016-10-31 NOTE — Progress Notes (Signed)
CRITICAL VALUE ALERT  Critical value received:  Troponin 1.26  Date of notification:  10/30/16  Time of notification:  2230  Critical value read back:Yes.    Nurse who received alert:  Effie Berkshire  MD notified (1st page):  Laban Emperor  Time of first page:  2240  MD notified (2nd page):  Time of second page:  Responding MD:  Laban Emperor  Time MD responded:  2245

## 2016-10-31 NOTE — Progress Notes (Signed)
PROGRESS NOTE    Joseph [name]  URK:[id number] DOB: Oct 20, 1929 DOA: 10/30/2016  PCP: Charlott Rakes, MD   Brief Narrative:   Joseph [name] is a 82 y.o. male from home with daughter with medical history significant of RA, HTN, alzheimer's dementia.  Presents to ED after family daughter noted 2 episodes of dyspnea lasting about 3-5 min after he stood up. He does not walk much.   Found to have a PE with a heavy clot burden.   Subjective: Patient is confused. He has no complaints.   Assessment & Plan:   Principal Problem:   Pulmonary embolism/ SIRS - HR > 100, RR > 20 - with cardiac strain- Trop max 1.26 - now trending down - cont Heparin- per PCCM it should be continued for 3 days - not hypoxic at rest - ECHO pending  Active Problems:  Lactic acidosis - due to above - LA 2.31 >> 1.25  Mild thrombocytopenia - likely due to clotting- follow while on anticoagulation    Pulmonary nodules - no smoking history- no f/u needed    HTN (hypertension) - Losartan on hold for now due to right heart strain    Alzheimer's dementia - Aricept    RA (rheumatoid arthritis)  - Methotrexate  Frequent falls - hip fracture last month - daughter had plans for him to be transitioned to [facility] nursing facility hopefully next week- will see if he can go there from here   DVT prophylaxis: Heparin infusion Code Status: DNR Family Communication: daughter Disposition Plan: transfer to med/surg Consultants:   PCCM Procedures:    Antimicrobials:  Anti-infectives    None       Objective: Vitals:   10/31/16 0400 10/31/16 0600 10/31/16 0632 10/31/16 0800  BP: 132/75 (!) 123/93  136/83  Pulse: 94 86  96  Resp: (!) 21 17  20   Temp:   97.9 F (36.6 C)   TempSrc:   Oral   SpO2: 98% 97%  99%  Weight:      Height:        Intake/Output Summary (Last 24 hours) at 10/31/16 1014 Last data filed at 10/31/16 0900  Gross per 24 hour  Intake            668.8 ml  Output               500 ml  Net            168.8 ml   Filed Weights   10/30/16 1513 10/30/16 2200  Weight: 77.1 kg (170 lb) 74 kg (163 lb 2.3 oz)    Examination: General exam: Appears comfortable  HEENT: PERRLA, oral mucosa moist, no sclera icterus or thrush Respiratory system: Clear to auscultation. Respiratory effort normal. Cardiovascular system: S1 & S2 heard, RRR.  No murmurs  Gastrointestinal system: Abdomen soft, non-tender, nondistended. Normal bowel sound. No organomegaly Central nervous system: Alert and oriented. No focal neurological deficits. Extremities: No cyanosis, clubbing or edema Skin: No rashes or ulcers Psychiatry:  Mood & affect appropriate.     Data Reviewed: I have personally reviewed following labs and imaging studies  CBC:  Recent Labs Lab 10/30/16 1504 10/31/16 0328  WBC 12.8* 8.5  NEUTROABS 11.1*  --   HGB 14.6 13.5  HCT 44.7 41.1  MCV 87.1 87.3  PLT 166 149*   Basic Metabolic Panel:  Recent Labs Lab 10/30/16 1504 10/31/16 0328  NA 136 136  K 4.1 4.3  CL 102 105  CO2 24 24  GLUCOSE 167*  133*  BUN 17 16  CREATININE 0.92 0.91  CALCIUM 9.0 8.7*   GFR: Estimated Creatinine Clearance: 55.5 mL/min (by C-G formula based on SCr of 0.91 mg/dL). Liver Function Tests:  Recent Labs Lab 10/30/16 1504  AST 30  ALT 47  ALKPHOS 115  BILITOT 0.7  PROT 7.1  ALBUMIN 3.6   No results for input(s): LIPASE, AMYLASE in the last 168 hours. No results for input(s): AMMONIA in the last 168 hours. Coagulation Profile: No results for input(s): INR, PROTIME in the last 168 hours. Cardiac Enzymes:  Recent Labs Lab 10/30/16 2052 10/31/16 0328 10/31/16 0804  TROPONINI 1.26* 0.65* 0.41*   BNP (last 3 results) No results for input(s): PROBNP in the last 8760 hours. HbA1C: No results for input(s): HGBA1C in the last 72 hours. CBG: No results for input(s): GLUCAP in the last 168 hours. Lipid Profile: No results for input(s): CHOL, HDL, LDLCALC, TRIG, CHOLHDL,  LDLDIRECT in the last 72 hours. Thyroid Function Tests: No results for input(s): TSH, T4TOTAL, FREET4, T3FREE, THYROIDAB in the last 72 hours. Anemia Panel: No results for input(s): VITAMINB12, FOLATE, FERRITIN, TIBC, IRON, RETICCTPCT in the last 72 hours. Urine analysis:    Component Value Date/Time   COLORURINE YELLOW 10/30/2016 1630   APPEARANCEUR CLEAR 10/30/2016 1630   LABSPEC 1.011 10/30/2016 1630   PHURINE 5.5 10/30/2016 1630   GLUCOSEU NEGATIVE 10/30/2016 1630   HGBUR NEGATIVE 10/30/2016 1630   BILIRUBINUR NEGATIVE 10/30/2016 1630   KETONESUR NEGATIVE 10/30/2016 1630   PROTEINUR NEGATIVE 10/30/2016 1630   NITRITE NEGATIVE 10/30/2016 1630   LEUKOCYTESUR NEGATIVE 10/30/2016 1630   Sepsis Labs: @LABRCNTIP (procalcitonin:4,lacticidven:4) ) Recent Results (from the past 240 hour(s))  MRSA PCR Screening     Status: None   Collection Time: 10/30/16 10:05 PM  Result Value Ref Range Status   MRSA by PCR NEGATIVE NEGATIVE Final    Comment:        The GeneXpert MRSA Assay (FDA approved for NASAL specimens only), is one component of a comprehensive MRSA colonization surveillance program. It is not intended to diagnose MRSA infection nor to guide or monitor treatment for MRSA infections.          Radiology Studies: Dg Chest 2 View  Result Date: 10/30/2016 CLINICAL DATA:  Acute onset of shortness of breath approximately 1 hour ago. EXAM: CHEST  2 VIEW COMPARISON:  10/15/2016 FINDINGS: Heart size is within normal limits.  Aortic atherosclerosis. Chronic elevation of left hemidiaphragm is stable. Both lungs are clear. No evidence of pneumothorax or pleural effusion. IMPRESSION: Stable chronic elevation of left hemidiaphragm. No active lung disease. Aortic atherosclerosis. Electronically Signed   By: 13/02/2016 M.D.   On: 10/30/2016 15:52   Ct Angio Chest Pe W And/or Wo Contrast  Result Date: 10/30/2016 CLINICAL DATA:  Sub onset shortness of breath. EXAM: CT ANGIOGRAPHY  CHEST WITH CONTRAST TECHNIQUE: Multidetector CT imaging of the chest was performed using the standard protocol during bolus administration of intravenous contrast. Multiplanar CT image reconstructions and MIPs were obtained to evaluate the vascular anatomy. CONTRAST:  100 cc Isovue 370. COMPARISON:  None. FINDINGS: Cardiovascular: There are filling defects in the pulmonary arteries bilaterally with the most proximal clot is seen in the distal right and left main pulmonary arteries. RV/LV ratio is 1.9. Atherosclerotic calcification of the arterial vasculature, including coronary arteries. Heart size normal. No pericardial effusion. Mediastinum/Nodes: No pathologically enlarged mediastinal, hilar or axillary lymph nodes. Esophagus is grossly unremarkable. Lungs/Pleura: Image quality is degraded by respiratory motion. A  few scattered pulmonary nodules measure 4 mm or less in size. Mild volume loss of the base of the left hemi thorax, adjacent to an elevated left hemidiaphragm. No pleural fluid. Airway is unremarkable. Upper Abdomen: Sub cm low-attenuation lesion in the left hepatic lobe is too small to characterize. Visualized portions of the liver, gallbladder, adrenal glands, left kidney, spleen, pancreas, stomach and bowel are otherwise grossly unremarkable. Musculoskeletal: No worrisome lytic or sclerotic lesions. Degenerative changes are seen in the spine. Review of the MIP images confirms the above findings. IMPRESSION: 1. Positive for acute PE with CT evidence of right heart strain (RV/LV Ratio = 1.9) consistent with at least submassive (intermediate risk) PE. The presence of right heart strain has been associated with an increased risk of morbidity and mortality. Please activate Code PE by paging [phone]. Critical Value/emergent results were called by telephone at the time of interpretation on 10/30/2016 at 6:05 pm to Dr. Crista Curb , who verbally acknowledged these results. 2. Aortic atherosclerosis  (ICD10-170.0). Coronary artery calcification. 3. Scattered pulmonary nodules measure 4 mm or less in size. No follow-up needed if patient is low-risk (and has no known or suspected primary neoplasm). Non-contrast chest C in T can be considered in 12 months if patient is high-risk. This recommendation follows the consensus statement: Guidelines for Management of Incidental Pulmonary Nodules Detected on CT Images: From the Fleischner Society 2017; Radiology 2017; 284:228-243. Electronically Signed   By: Leanna Battles M.D.   On: 10/30/2016 18:10      Scheduled Meds: . calcium-vitamin D  1 tablet Oral BID WC  . citalopram  10 mg Oral Daily  . donepezil  10 mg Oral Daily  . folic acid  1 mg Oral Daily  . mouth rinse  15 mL Mouth Rinse BID  . [START ON 11/02/2016] methotrexate  12.5 mg Oral Weekly  . sodium chloride flush  3 mL Intravenous Q12H   Continuous Infusions: . heparin       LOS: 1 day    Time spent in minutes: 35    Korryn Pancoast, MD [facility] Pager: [email or web address] Password [email or web address] 10/31/2016, 10:14 AM

## 2016-10-31 NOTE — Progress Notes (Signed)
ANTICOAGULATION CONSULT NOTE - Follow Up  Pharmacy Consult for IV heparin Indication: pulmonary embolus  Patient Measurements: Height: 5\' 7"  (170.2 cm) Weight: 163 lb 2.3 oz (74 kg) IBW/kg (Calculated) : 66.1 Heparin Dosing Weight: 77  Infusions:  . heparin 1,200 Units/hr (10/31/16 1512)    Assessment: 82 yo male presented to ER with shortness of breath found to have new PE to start IV heparin per pharmacy dosing.  IV heparin changed to Lovenox, but CCM consulted and did not want Lovenox d/t risk for decompensation and need for rescue lytic therapy.  Changing back to IV heparin - first dose of Lovenox never given, heparin off for ~1 hour.   Heparin restarted at previous rate of 1200 units/hr  HL 0.51, remains therapeutic  No bleeding or IV complications reported.   Goal of Therapy:  Heparin level 0.3-0.7 units/ml Monitor platelets by anticoagulation protocol: Yes   Plan:   Continue heparin IV infusion at 1200 units/hr  Heparin level in 8 hours to confirm therapeutic level  Daily heparin level and CBC   83 PharmD, BCPS Pager [phone] 10/31/2016 7:22 PM

## 2016-10-31 NOTE — Consult Note (Addendum)
Name: Gabriele Loveland MRN: [id number] DOB: [birth year]    ADMISSION DATE:  10/30/2016 CONSULTATION DATE:  10/30/2016  REFERRING MD :  Julian Reil  CHIEF COMPLAINT:  Pulmonary embolism  BRIEF PATIENT DESCRIPTION  brief Mr. Schwertner is an 62M with PMH significant for dementia (moderately advanced), hypertension and rheumatoid arthritis, who presents from home via EMS with complaints of shortness of breath. He had a recent hip fracture (09/19/16), with markedly decreased mobility for the past several weeks. He was transitioning from wheelchair to walker and became acutely short of breath. His daughter had him sit back down to catch his breath. When she went to check on him a bit later, he was still very short of breath and said he needed to use the bathroom. He continued to be short of breath and reportedly got pale while EMS was being called. Imaging in the ED revealed bilateral pulmonary emboli in the distal right and left main pulmonary arteries. RV/LV ratio 1.9 with mild troponin leak, normal BNP and no reports of hypotension, syncope or near-syncope. The patient was started on a heparin gtt and admitted to stepdown. His daughter provides most of the history. He does not feel short of breath while at rest and is lying comfortably in the bed. No chest pain with deep inspiration. No palpitations. No cough / sputum / hemoptysis. No nausea or vomiting. No lower extremity edema or asymmetry to speak of. ROS otherwise difficult to obtain 2/2 hx of dementia.   has a past medical history of Alzheimer's dementia; Hypertension; and RA (rheumatoid arthritis) (HCC).  \\ SIGNIFICANT EVENTS  - admit 10/30/2016 - Submassive PE  (RV/LV =1.9)with left chronic diaph elevation and Co art calcificati  And pul nodules < 53mm. PESI score 135 class 5 (age,. Male and HR 114,  Needing 2L [place])   SUBJECTIVE:   11/19 - echo ongoing. With 2L [place] pulse ox 96%, HR 91 (and improved), BP 136 sbp and resting quietly. On IV heparin gtt.  PESI improved to 95 - on room air x 5 min pulse ox 96%  And HR 91.   VITAL SIGNS: Temp:  [97.7 F (36.5 C)-98.4 F (36.9 C)] 97.9 F (36.6 C) (11/19 8101) Pulse Rate:  [86-115] 96 (11/19 0800) Resp:  [16-23] 20 (11/19 0800) BP: (123-136)/(75-93) 136/83 (11/19 0800) SpO2:  [95 %-100 %] 99 % (11/19 0800) Weight:  [74 kg (163 lb 2.3 oz)-77.1 kg (170 lb)] 74 kg (163 lb 2.3 oz) (11/18 2200)  PHYSICAL EXAMINATION:  General Well nourished, well developed, no apparent distress, resting comfortably  HEENT No gross abnormalities. Oropharynx clear.   Pulmonary Clear to auscultation bilaterally with no wheezes, rales or ronchi. Good effort, symmetrical expansion.   Cardiovascular Mild tachycardia 90s improveds, regular rhythm. S1, s2. No m/r/g. Distal pulses palpable.  Abdomen Soft, non-tender, non-distended, positive bowel sounds, no palpable organomegaly or masses. Normoresonant to percussion.  Musculoskeletal Grossly normal  Lymphatics No cervical, supraclavicular or axillary adenopathy.   Neurologic Grossly intact. No focal deficits.   Skin/Integuement No rash, no cyanosis, no clubbing.      PULMONARY No results for input(s): PHART, PCO2ART, PO2ART, HCO3, TCO2, O2SAT in the last 168 hours.  Invalid input(s): PCO2, PO2  CBC  Recent Labs Lab 10/30/16 1504 10/31/16 0328  HGB 14.6 13.5  HCT 44.7 41.1  WBC 12.8* 8.5  PLT 166 149*    COAGULATION No results for input(s): INR in the last 168 hours.  CARDIAC   Recent Labs Lab 10/30/16 2052 10/31/16 0328 10/31/16  0804  TROPONINI 1.26* 0.65* 0.41*   No results for input(s): PROBNP in the last 168 hours.   CHEMISTRY  Recent Labs Lab 10/30/16 1504 10/31/16 0328  NA 136 136  K 4.1 4.3  CL 102 105  CO2 24 24  GLUCOSE 167* 133*  BUN 17 16  CREATININE 0.92 0.91  CALCIUM 9.0 8.7*   Estimated Creatinine Clearance: 55.5 mL/min (by C-G formula based on SCr of 0.91 mg/dL).   LIVER  Recent Labs Lab 10/30/16 1504    AST 30  ALT 47  ALKPHOS 115  BILITOT 0.7  PROT 7.1  ALBUMIN 3.6     INFECTIOUS  Recent Labs Lab 10/30/16 1606 10/30/16 1811  LATICACIDVEN 2.31* 1.25     ENDOCRINE CBG (last 3)  No results for input(s): GLUCAP in the last 72 hours.       IMAGING x48h  - image(s) personally visualized  -   highlighted in bold Dg Chest 2 View  Result Date: 10/30/2016 CLINICAL DATA:  Acute onset of shortness of breath approximately 1 hour ago. EXAM: CHEST  2 VIEW COMPARISON:  10/15/2016 FINDINGS: Heart size is within normal limits.  Aortic atherosclerosis. Chronic elevation of left hemidiaphragm is stable. Both lungs are clear. No evidence of pneumothorax or pleural effusion. IMPRESSION: Stable chronic elevation of left hemidiaphragm. No active lung disease. Aortic atherosclerosis. Electronically Signed   By: Myles Rosenthal M.D.   On: 10/30/2016 15:52   Ct Angio Chest Pe W And/or Wo Contrast  Result Date: 10/30/2016 CLINICAL DATA:  Sub onset shortness of breath. EXAM: CT ANGIOGRAPHY CHEST WITH CONTRAST TECHNIQUE: Multidetector CT imaging of the chest was performed using the standard protocol during bolus administration of intravenous contrast. Multiplanar CT image reconstructions and MIPs were obtained to evaluate the vascular anatomy. CONTRAST:  100 cc Isovue 370. COMPARISON:  None. FINDINGS: Cardiovascular: There are filling defects in the pulmonary arteries bilaterally with the most proximal clot is seen in the distal right and left main pulmonary arteries. RV/LV ratio is 1.9. Atherosclerotic calcification of the arterial vasculature, including coronary arteries. Heart size normal. No pericardial effusion. Mediastinum/Nodes: No pathologically enlarged mediastinal, hilar or axillary lymph nodes. Esophagus is grossly unremarkable. Lungs/Pleura: Image quality is degraded by respiratory motion. A few scattered pulmonary nodules measure 4 mm or less in size. Mild volume loss of the base of the left hemi  thorax, adjacent to an elevated left hemidiaphragm. No pleural fluid. Airway is unremarkable. Upper Abdomen: Sub cm low-attenuation lesion in the left hepatic lobe is too small to characterize. Visualized portions of the liver, gallbladder, adrenal glands, left kidney, spleen, pancreas, stomach and bowel are otherwise grossly unremarkable. Musculoskeletal: No worrisome lytic or sclerotic lesions. Degenerative changes are seen in the spine. Review of the MIP images confirms the above findings. IMPRESSION: 1. Positive for acute PE with CT evidence of right heart strain (RV/LV Ratio = 1.9) consistent with at least submassive (intermediate risk) PE. The presence of right heart strain has been associated with an increased risk of morbidity and mortality. Please activate Code PE by paging [phone]. Critical Value/emergent results were called by telephone at the time of interpretation on 10/30/2016 at 6:05 pm to Dr. Crista Curb , who verbally acknowledged these results. 2. Aortic atherosclerosis (ICD10-170.0). Coronary artery calcification. 3. Scattered pulmonary nodules measure 4 mm or less in size. No follow-up needed if patient is low-risk (and has no known or suspected primary neoplasm). Non-contrast chest C in T can be considered in 12 months if patient is  high-risk. This recommendation follows the consensus statement: Guidelines for Management of Incidental Pulmonary Nodules Detected on CT Images: From the Fleischner Society 2017; Radiology 2017; 284:228-243. Electronically Signed   By: Leanna Battles M.D.   On: 10/30/2016 18:10      ASSESSMENT / PLAN:   Acute submassive PE Etiology: sedentary  And hip surgery 09/19/16 and elderly SEverity: At admit - class 5 PESI     - highest seveirty short of hemodynamic instability  (based on age, HR, o2 need ). This fits in with CT RV strain fidnings and troponin leak  And elevated lacitate RX: IV heparin gtt at admit   - normally he would meet indication for local  thrombolysuis but given age and dementia was appriopriate to hold off   COURSE 10/31/16   - significant improvement with IV heparin gtt. PESI improved to clas 3 with improvement in o2 need and 93% on RA x 15 min and HR 87   PLAN   - DC lovenox (not given yet) due to potential need for rescue therapy - see below    - restart IV heparing gtt - recommend total 3-5 days of IV Heparin gtt. Though he is improving, there is a risk for decompensation of PE in first 72h. At this point, this PCCM MD would definitely recommend local lysis as rescue therapy (I do not see a contraindication 0- hip surgery was > 4 weeks ago). IF situation arises for rescue therapy - contraindications have to be evaluted again    - Oral agent decision of NOAC v coumadin - though coumadin can be monitorded overall risk of bleeding with eg: Elquis is lower than Coumadin. He has normal GFR.    - Recommend 6-12 months (longer the better) with reassessment every 3 months for bleeding risk.    - At end of 6-12 months, do half dose NOAc of lower INR goal with coumadin or daily aspirin for another 6-12 months  to reduce risk for DVT/PE risk and balance bleeding risk./ Can use d-dimer check to asses risk for recurrent thrombosis    - DNR to continue   - Move to med-surg wih PCCM monitoring on chart but signingi off otherwise    - d/w Dr Butler Denmark and daughter updated   Dr. Kalman Shan, M.D., [facility].C.P Pulmonary and Critical Care Medicine Staff Physician [facility] Pager: [phone], If no answer or between  15:00h - 7:00h: call [phone]  10/31/2016 9:58 AM

## 2016-10-31 NOTE — Progress Notes (Signed)
ANTICOAGULATION CONSULT NOTE - Initial Consult  Pharmacy Consult for IV heparin to change to Lovenox Indication: pulmonary embolus  Allergies  Allergen Reactions  . Penicillins Other (See Comments)    Family thinks pt is allergic to penicillin but not sure     Patient Measurements: Height: 5\' 7"  (170.2 cm) Weight: 163 lb 2.3 oz (74 kg) IBW/kg (Calculated) : 66.1 Heparin Dosing Weight: 77  Vital Signs: Temp: 97.9 F (36.6 C) (11/19 0632) Temp Source: Oral (11/19 07-23-1982) BP: 123/93 (11/19 0600) Pulse Rate: 86 (11/19 0600)  Labs:  Recent Labs  10/30/16 1504 10/30/16 2052 10/31/16 0328  HGB 14.6  --  13.5  HCT 44.7  --  41.1  PLT 166  --  149*  APTT  --  91*  --   HEPARINUNFRC  --   --  0.68  CREATININE 0.92  --  0.91  TROPONINI  --  1.26* 0.65*    Estimated Creatinine Clearance: 55.5 mL/min (by C-G formula based on SCr of 0.91 mg/dL).   Medical History: Past Medical History:  Diagnosis Date  . Alzheimer's dementia   . Hypertension   . RA (rheumatoid arthritis) (HCC)     Medications:  Scheduled:  . calcium-vitamin D  1 tablet Oral BID WC  . citalopram  10 mg Oral Daily  . donepezil  10 mg Oral Daily  . folic acid  1 mg Oral Daily  . mouth rinse  15 mL Mouth Rinse BID  . [START ON 11/02/2016] methotrexate  12.5 mg Oral Weekly  . sodium chloride flush  3 mL Intravenous Q12H   Infusions:  . heparin 1,200 Units/hr (10/30/16 1845)    Assessment: 82 yo male presented to ER with shortness of breath found to have new PE to start IV heparin per pharmacy dosing. Baseline labs WNL.   10/31/16  To change IV heparin to Lovenox this AM  No complications or bleeding noted  Goal of Therapy:  Heparin level 0.3-0.7 units/ml Monitor platelets by anticoagulation protocol: Yes   Plan:  1) Discontinue IV heparin now 2) 1 hr after stopping heparin, start Lovenox 1mg /kg SQ q12 3) Follow for start of oral anticoagulation (CCM recommended warfarin) .  4) ReCheck  heparin level in 8 hours  5) Daily heparin level and CBC   11/02/16, PharmD, BCPS Pager [phone] 10/31/2016 8:38 AM

## 2016-10-31 NOTE — Progress Notes (Signed)
ANTICOAGULATION CONSULT NOTE - Follow Up  Pharmacy Consult for IV heparin Indication: pulmonary embolus  Allergies  Allergen Reactions  . Penicillins Other (See Comments)    Family thinks pt is allergic to penicillin but not sure     Patient Measurements: Height: 5\' 7"  (170.2 cm) Weight: 163 lb 2.3 oz (74 kg) IBW/kg (Calculated) : 66.1 Heparin Dosing Weight: 77  Vital Signs: Temp: 97.9 F (36.6 C) (11/19 0632) Temp Source: Oral (11/19 07-23-1982) BP: 136/83 (11/19 0800) Pulse Rate: 96 (11/19 0800)  Labs:  Recent Labs  10/30/16 1504 10/30/16 2052 10/31/16 0328 10/31/16 0804  HGB 14.6  --  13.5  --   HCT 44.7  --  41.1  --   PLT 166  --  149*  --   APTT  --  91*  --   --   HEPARINUNFRC  --   --  0.68  --   CREATININE 0.92  --  0.91  --   TROPONINI  --  1.26* 0.65* 0.41*    Estimated Creatinine Clearance: 55.5 mL/min (by C-G formula based on SCr of 0.91 mg/dL).   Medical History: Past Medical History:  Diagnosis Date  . Alzheimer's dementia   . Hypertension   . RA (rheumatoid arthritis) (HCC)     Medications:  Scheduled:  . calcium-vitamin D  1 tablet Oral BID WC  . citalopram  10 mg Oral Daily  . donepezil  10 mg Oral Daily  . folic acid  1 mg Oral Daily  . mouth rinse  15 mL Mouth Rinse BID  . [START ON 11/02/2016] methotrexate  12.5 mg Oral Weekly  . sodium chloride flush  3 mL Intravenous Q12H   Infusions:  . heparin      Assessment: 82 yo male presented to ER with shortness of breath found to have new PE to start IV heparin per pharmacy dosing. Baseline labs WNL.   10/31/16  CCM consulted and did not want Lovenox started so changing back to IV heparin - first dose of Lovenox never given  No complications or bleeding noted  Goal of Therapy:  Heparin level 0.3-0.7 units/ml Monitor platelets by anticoagulation protocol: Yes   Plan:  1) Restart IV heparin at previous rate of 1200 units/hr 2) Follow for start of oral anticoagulation (CCM  recommended warfarin) .  4) ReCheck heparin level in 8 hours  5) Daily heparin level and CBC   11/02/16, PharmD, BCPS  Pager [phone] 10/31/2016 9:59 AM

## 2016-10-31 NOTE — Progress Notes (Signed)
ANTICOAGULATION CONSULT NOTE - Initial Consult  Pharmacy Consult for IV heparin Indication: pulmonary embolus  Allergies  Allergen Reactions  . Penicillins Other (See Comments)    Family thinks pt is allergic to penicillin but not sure     Patient Measurements: Height: 5\' 7"  (170.2 cm) Weight: 163 lb 2.3 oz (74 kg) IBW/kg (Calculated) : 66.1 Heparin Dosing Weight: 77  Vital Signs: Temp: 98.4 F (36.9 C) (11/19 0000) Temp Source: Oral (11/18 2200) BP: 132/75 (11/19 0400) Pulse Rate: 94 (11/19 0400)  Labs:  Recent Labs  10/30/16 1504 10/30/16 2052 10/31/16 0328  HGB 14.6  --  13.5  HCT 44.7  --  41.1  PLT 166  --  149*  APTT  --  91*  --   HEPARINUNFRC  --   --  0.68  CREATININE 0.92  --  0.91  TROPONINI  --  1.26* 0.65*    Estimated Creatinine Clearance: 55.5 mL/min (by C-G formula based on SCr of 0.91 mg/dL).   Medical History: Past Medical History:  Diagnosis Date  . Alzheimer's dementia   . Hypertension   . RA (rheumatoid arthritis) (HCC)     Medications:  Scheduled:  . calcium-vitamin D  1 tablet Oral BID WC  . citalopram  10 mg Oral Daily  . donepezil  10 mg Oral Daily  . folic acid  1 mg Oral Daily  . mouth rinse  15 mL Mouth Rinse BID  . [START ON 11/02/2016] methotrexate  12.5 mg Oral Weekly  . sodium chloride flush  3 mL Intravenous Q12H   Infusions:  . heparin 1,200 Units/hr (10/30/16 1845)    Assessment: 82 yo male presented to ER with shortness of breath found to have new PE to start IV heparin per pharmacy dosing. Baseline labs WNL.   10/31/16  Heparin level = 0.68 with heparin infusing @ 1200 units/hr  No complications of therapy noted  Goal of Therapy:  Heparin level 0.3-0.7 units/ml Monitor platelets by anticoagulation protocol: Yes   Plan:  1) Continue IV heparin @ rate of 1200 units/hr 2) Follow for start of oral anticoagulation (CCM recommended warfarin) . Note that IV heparin is not treatment of choice for PE per  uidelines - recommend switching as soon as possible 3) ReCheck heparin level in 8 hours  4) Daily heparin level and CBC   11/02/16, PharmD 10/31/2016 5:36 AM

## 2016-10-31 NOTE — Progress Notes (Signed)
RN informed Dr Julian Reil of critical troponin of 0.65;

## 2016-11-01 LAB — CBC
HEMATOCRIT: 39.5 % (ref 39.0–52.0)
HEMOGLOBIN: 12.9 g/dL — AB (ref 13.0–17.0)
MCH: 28.4 pg (ref 26.0–34.0)
MCHC: 32.7 g/dL (ref 30.0–36.0)
MCV: 87 fL (ref 78.0–100.0)
Platelets: 135 10*3/uL — ABNORMAL LOW (ref 150–400)
RBC: 4.54 MIL/uL (ref 4.22–5.81)
RDW: 14.7 % (ref 11.5–15.5)
WBC: 6.5 10*3/uL (ref 4.0–10.5)

## 2016-11-01 LAB — PHOSPHORUS: Phosphorus: 3.2 mg/dL (ref 2.5–4.6)

## 2016-11-01 LAB — HEPARIN LEVEL (UNFRACTIONATED): HEPARIN UNFRACTIONATED: 0.63 [IU]/mL (ref 0.30–0.70)

## 2016-11-01 LAB — MAGNESIUM: MAGNESIUM: 1.9 mg/dL (ref 1.7–2.4)

## 2016-11-01 NOTE — Progress Notes (Signed)
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  Only non blood management

## 2016-11-01 NOTE — Progress Notes (Signed)
ANTICOAGULATION CONSULT NOTE - Follow Up  Pharmacy Consult for IV heparin Indication: pulmonary embolus  Patient Measurements: Height: 5\' 7"  (170.2 cm) Weight: 163 lb 2.3 oz (74 kg) IBW/kg (Calculated) : 66.1 Heparin Dosing Weight: 77  Infusions:  . heparin 1,200 Units/hr (10/31/16 1512)    Assessment: 82 yo male presented to ER with shortness of breath found to have new PE to start IV heparin per pharmacy dosing.  IV heparin changed to Lovenox, but CCM consulted and did not want Lovenox d/t risk for decompensation and need for rescue lytic therapy.  Changing back to IV heparin - first dose of Lovenox never given, heparin off for ~1 hour.    HL 0.63, remains therapeutic  No bleeding or IV complications reported.  Plts decreasing  H/H WNL   Goal of Therapy:  Heparin level 0.3-0.7 units/ml Monitor platelets by anticoagulation protocol: Yes   Plan:   Continue heparin IV infusion at 1200 units/hr  Daily heparin level and CBC   83 RPh 11/01/2016, 8:39 AM Pager [phone]

## 2016-11-01 NOTE — Clinical Social Work Note (Signed)
Clinical Social Work Assessment  Patient Details  Name: Joseph [name] MRN: [id number] Date of Birth: [birth year]  Date of referral:  11/01/16               Reason for consult:  Facility Placement, Discharge Planning                Permission sought to share information with:  Oceanographer granted to share information::  Yes, Verbal Permission Granted  Name::        Agency::     Relationship::     Contact Information:     Housing/Transportation Living arrangements for the past 2 months:  Single Family Home Source of Information:  Adult Children Patient Interpreter Needed:  None Criminal Activity/Legal Involvement Pertinent to Current Situation/Hospitalization:  No - Comment as needed Significant Relationships:  Adult Children Lives with:  Adult Children Do you feel safe going back to the place where you live?  No (SNF needed.) Need for family participation in patient care:     Care giving concerns:  Pt's car cannot be managed at home following hospital d/c.   Social Worker assessment / plan:  Pt hospitalized from home on  10/30/16 with a pulmonary embolism. Pt has dementia and isn't able to participate in d/c planning. Pt's daughter reports that she has made prior arrangements for pt to have SNF placement at [facility] at d/c. CSW has contacted SNF and clinicals sent for review. D/c plan has been confirmed. CSW will continue to follow to assist with d/c planning to SNF. Employment status:  Retired Health and safety inspector:  Medicare PT Recommendations:  Not assessed at this time Information / Referral to community resources:  Skilled Nursing Facility  Patient/Family's Response to care:  Daughter has made prior arrangements for SNF at d/c.  Patient/Family's Understanding of and Emotional Response to Diagnosis, Current Treatment, and Prognosis: Daughter is aware of pt's medical status. She is looking forward to pt going to [facility] at  d/c. Daughter appreciates assistance provided by CSW.  Emotional Assessment Appearance:  Appears stated age Attitude/Demeanor/Rapport:  Unable to Assess Affect (typically observed):  Unable to Assess Orientation:  Oriented to Self Alcohol / Substance use:  Not Applicable Psych involvement (Current and /or in the community):  No (Comment)  Discharge Needs  Concerns to be addressed:  Discharge Planning Concerns Readmission within the last 30 days:  Yes Current discharge risk:  None Barriers to Discharge:  No Barriers Identified   Joseph Asal, LCSW  [phone] 11/01/2016, 10:04 AM

## 2016-11-01 NOTE — Progress Notes (Addendum)
PROGRESS NOTE    Joseph [name]  HYW:[id number] DOB: [birth year] DOA: 10/30/2016  PCP: Nicolasa Ducking, MD   Brief Narrative:   Joseph [name] is a 82 y.o. male from home with daughter with medical history significant of RA, HTN, alzheimer's dementia.  Presents to ED after family daughter noted 2 episodes of dyspnea lasting about 3-5 min after he stood up. He does not walk much.   Found to have a PE with a heavy clot burden.   Subjective: Patient is confused. He has no complaints.   Assessment & Plan:   Principal Problem:   Pulmonary embolism/ SIRS - HR > 100, RR > 20 - with cardiac strain- Trop max 1.26 and subsequently trending down - not hypoxic at rest - ECHO  Shows akinesis of the apex of the RV - cont Heparin- per PCCM it should be continued for 3 days minimum- further recommendations:  Oral agent decision of NOAC v coumadin - though coumadin can be monitorded overall risk of bleeding with eg: Elquis is lower than Coumadin. He has normal GFR.                                - Recommend 6-12 months (longer the better) with reassessment every 3 months for bleeding risk.                                - At end of 6-12 months, do half dose NOAc of lower INR goal with coumadin or daily aspirin for another 6-12 months  to reduce risk for DVT/PE risk and balance bleeding risk./ Can use d-dimer check to asses risk for recurrent thrombosis    Active Problems:  Lactic acidosis - due to above - LA 2.31 >> 1.25  Mild thrombocytopenia - likely due to clotting- follow while on anticoagulation    Pulmonary nodules - no smoking history- ?rheumatiod    HTN (hypertension) - Losartan on hold for now due to right heart strain    Alzheimer's dementia - Aricept    RA (rheumatoid arthritis)  - Methotrexate  Frequent falls - hip fracture last month - daughter had plans for him to be transitioned to [facility] nursing facility hopefully next week- will see if he can go there from  here   DVT prophylaxis: Heparin infusion Code Status: DNR Family Communication: daughter Disposition Plan: transfer to med/surg Consultants:   PCCM Procedures:  2 d ECHO Normal LV systolic function; grade 1 diastolic dysfunction; mild   TR; akinesis of the apical RV consistent with Mcconnell&'s sign   (suggestive of pulmonary embolus). Antimicrobials:  Anti-infectives    None       Objective: Vitals:   10/31/16 1400 10/31/16 1500 10/31/16 2110 11/01/16 0413  BP: 107/66 116/68 112/70 131/71  Pulse: 83  85 78  Resp: 20 20 16 18   Temp:  98.3 F (36.8 C) 97.7 F (36.5 C) 97 F (36.1 C)  TempSrc:  Oral Oral Oral  SpO2: 97% 97% 96% 95%  Weight:      Height:        Intake/Output Summary (Last 24 hours) at 11/01/16 1059 Last data filed at 11/01/16 0535  Gross per 24 hour  Intake            275.3 ml  Output             1000 ml  Net           -  724.7 ml   Filed Weights   10/30/16 1513 10/30/16 2200  Weight: 77.1 kg (170 lb) 74 kg (163 lb 2.3 oz)    Examination: General exam: Appears comfortable  HEENT: PERRLA, oral mucosa moist, no sclera icterus or thrush Respiratory system: Clear to auscultation. Respiratory effort normal. Cardiovascular system: S1 & S2 heard, RRR.  No murmurs  Gastrointestinal system: Abdomen soft, non-tender, nondistended. Normal bowel sound. No organomegaly Central nervous system: Alert and oriented. No focal neurological deficits. Extremities: No cyanosis, clubbing or edema Skin: No rashes or ulcers Psychiatry:  Mood & affect appropriate.     Data Reviewed: I have personally reviewed following labs and imaging studies  CBC:  Recent Labs Lab 10/30/16 1504 10/31/16 0328 11/01/16 0353  WBC 12.8* 8.5 6.5  NEUTROABS 11.1*  --   --   HGB 14.6 13.5 12.9*  HCT 44.7 41.1 39.5  MCV 87.1 87.3 87.0  PLT 166 149* 135*   Basic Metabolic Panel:  Recent Labs Lab 10/30/16 1504 10/31/16 0328 11/01/16 0353  NA 136 136  --   K 4.1 4.3  --    CL 102 105  --   CO2 24 24  --   GLUCOSE 167* 133*  --   BUN 17 16  --   CREATININE 0.92 0.91  --   CALCIUM 9.0 8.7*  --   MG  --   --  1.9  PHOS  --   --  3.2   GFR: Estimated Creatinine Clearance: 55.5 mL/min (by C-G formula based on SCr of 0.91 mg/dL). Liver Function Tests:  Recent Labs Lab 10/30/16 1504  AST 30  ALT 47  ALKPHOS 115  BILITOT 0.7  PROT 7.1  ALBUMIN 3.6   No results for input(s): LIPASE, AMYLASE in the last 168 hours. No results for input(s): AMMONIA in the last 168 hours. Coagulation Profile: No results for input(s): INR, PROTIME in the last 168 hours. Cardiac Enzymes:  Recent Labs Lab 10/30/16 2052 10/31/16 0328 10/31/16 0804  TROPONINI 1.26* 0.65* 0.41*   BNP (last 3 results) No results for input(s): PROBNP in the last 8760 hours. HbA1C: No results for input(s): HGBA1C in the last 72 hours. CBG: No results for input(s): GLUCAP in the last 168 hours. Lipid Profile: No results for input(s): CHOL, HDL, LDLCALC, TRIG, CHOLHDL, LDLDIRECT in the last 72 hours. Thyroid Function Tests: No results for input(s): TSH, T4TOTAL, FREET4, T3FREE, THYROIDAB in the last 72 hours. Anemia Panel: No results for input(s): VITAMINB12, FOLATE, FERRITIN, TIBC, IRON, RETICCTPCT in the last 72 hours. Urine analysis:    Component Value Date/Time   COLORURINE YELLOW 10/30/2016 1630   APPEARANCEUR CLEAR 10/30/2016 1630   LABSPEC 1.011 10/30/2016 1630   PHURINE 5.5 10/30/2016 1630   GLUCOSEU NEGATIVE 10/30/2016 1630   HGBUR NEGATIVE 10/30/2016 1630   BILIRUBINUR NEGATIVE 10/30/2016 1630   KETONESUR NEGATIVE 10/30/2016 1630   PROTEINUR NEGATIVE 10/30/2016 1630   NITRITE NEGATIVE 10/30/2016 1630   LEUKOCYTESUR NEGATIVE 10/30/2016 1630   Sepsis Labs: @LABRCNTIP (procalcitonin:4,lacticidven:4) ) Recent Results (from the past 240 hour(s))  MRSA PCR Screening     Status: None   Collection Time: 10/30/16 10:05 PM  Result Value Ref Range Status   MRSA by PCR  NEGATIVE NEGATIVE Final    Comment:        The GeneXpert MRSA Assay (FDA approved for NASAL specimens only), is one component of a comprehensive MRSA colonization surveillance program. It is not intended to diagnose MRSA infection nor to guide or monitor  treatment for MRSA infections.          Radiology Studies: Dg Chest 2 View  Result Date: 10/30/2016 CLINICAL DATA:  Acute onset of shortness of breath approximately 1 hour ago. EXAM: CHEST  2 VIEW COMPARISON:  10/15/2016 FINDINGS: Heart size is within normal limits.  Aortic atherosclerosis. Chronic elevation of left hemidiaphragm is stable. Both lungs are clear. No evidence of pneumothorax or pleural effusion. IMPRESSION: Stable chronic elevation of left hemidiaphragm. No active lung disease. Aortic atherosclerosis. Electronically Signed   By: Myles Rosenthal M.D.   On: 10/30/2016 15:52   Ct Angio Chest Pe W And/or Wo Contrast  Result Date: 10/30/2016 CLINICAL DATA:  Sub onset shortness of breath. EXAM: CT ANGIOGRAPHY CHEST WITH CONTRAST TECHNIQUE: Multidetector CT imaging of the chest was performed using the standard protocol during bolus administration of intravenous contrast. Multiplanar CT image reconstructions and MIPs were obtained to evaluate the vascular anatomy. CONTRAST:  100 cc Isovue 370. COMPARISON:  None. FINDINGS: Cardiovascular: There are filling defects in the pulmonary arteries bilaterally with the most proximal clot is seen in the distal right and left main pulmonary arteries. RV/LV ratio is 1.9. Atherosclerotic calcification of the arterial vasculature, including coronary arteries. Heart size normal. No pericardial effusion. Mediastinum/Nodes: No pathologically enlarged mediastinal, hilar or axillary lymph nodes. Esophagus is grossly unremarkable. Lungs/Pleura: Image quality is degraded by respiratory motion. A few scattered pulmonary nodules measure 4 mm or less in size. Mild volume loss of the base of the left hemi  thorax, adjacent to an elevated left hemidiaphragm. No pleural fluid. Airway is unremarkable. Upper Abdomen: Sub cm low-attenuation lesion in the left hepatic lobe is too small to characterize. Visualized portions of the liver, gallbladder, adrenal glands, left kidney, spleen, pancreas, stomach and bowel are otherwise grossly unremarkable. Musculoskeletal: No worrisome lytic or sclerotic lesions. Degenerative changes are seen in the spine. Review of the MIP images confirms the above findings. IMPRESSION: 1. Positive for acute PE with CT evidence of right heart strain (RV/LV Ratio = 1.9) consistent with at least submassive (intermediate risk) PE. The presence of right heart strain has been associated with an increased risk of morbidity and mortality. Please activate Code PE by paging [phone]. Critical Value/emergent results were called by telephone at the time of interpretation on 10/30/2016 at 6:05 pm to Dr. Crista Curb , who verbally acknowledged these results. 2. Aortic atherosclerosis (ICD10-170.0). Coronary artery calcification. 3. Scattered pulmonary nodules measure 4 mm or less in size. No follow-up needed if patient is low-risk (and has no known or suspected primary neoplasm). Non-contrast chest C in T can be considered in 12 months if patient is high-risk. This recommendation follows the consensus statement: Guidelines for Management of Incidental Pulmonary Nodules Detected on CT Images: From the Fleischner Society 2017; Radiology 2017; 284:228-243. Electronically Signed   By: Leanna Battles M.D.   On: 10/30/2016 18:10      Scheduled Meds: . calcium-vitamin D  1 tablet Oral BID WC  . citalopram  10 mg Oral Daily  . donepezil  10 mg Oral Daily  . folic acid  1 mg Oral Daily  . mouth rinse  15 mL Mouth Rinse BID  . [START ON 11/02/2016] methotrexate  12.5 mg Oral Weekly  . sodium chloride flush  3 mL Intravenous Q12H   Continuous Infusions: . heparin 1,200 Units/hr (10/31/16 1512)     LOS:  2 days    Time spent in minutes: 35    Joseph Moten, MD [facility] Pager: [email or web address]  Password [email or web address] 11/01/2016, 10:59 AM

## 2016-11-01 NOTE — NC FL2 (Signed)
[place] MEDICAID FL2 LEVEL OF CARE SCREENING TOOL     IDENTIFICATION  Patient Name: Rama Mcclintock Birthdate: [birth year] Sex: male Admission Date (Current Location): 10/30/2016  [facility] and [place] Number:  Holiday representative and Address:  [facility],  [address], [place] [address]      Provider Number: [id number]  Attending Physician Name and Address:  Calvert Cantor, MD  Relative Name and Phone Number:       Current Level of Care: Hospital Recommended Level of Care: Skilled Nursing Facility Prior Approval Number:    Date Approved/Denied:   PASRR Number:    Discharge Plan: SNF    Current Diagnoses: Patient Active Problem List   Diagnosis Date Noted  . Pulmonary embolism (HCC) 10/30/2016  . Pulmonary nodules 10/30/2016  . HTN (hypertension) 10/30/2016  . Alzheimer's dementia 10/30/2016  . RA (rheumatoid arthritis) (HCC) 10/30/2016    Orientation RESPIRATION BLADDER Height & Weight     Self  Normal Continent Weight: 163 lb 2.3 oz (74 kg) Height:  5\' 7"  (170.2 cm)  BEHAVIORAL SYMPTOMS/MOOD NEUROLOGICAL BOWEL NUTRITION STATUS  Other (Comment) (no behaviors)   Continent Diet  AMBULATORY STATUS COMMUNICATION OF NEEDS Skin   Extensive Assist Verbally Normal                       Personal Care Assistance Level of Assistance  Bathing, Feeding, Dressing Bathing Assistance: Maximum assistance Feeding assistance: Limited assistance Dressing Assistance: Maximum assistance     Functional Limitations Info  Sight, Hearing, Speech   Hearing Info: Adequate Speech Info: Adequate    SPECIAL CARE FACTORS FREQUENCY  PT (By licensed PT)     PT Frequency: 5x wk              Contractures Contractures Info: Not present    Additional Factors Info  Allergies Code Status Info: PENICILLINS             Current Medications (11/01/2016):  This is the current hospital active medication list Current Facility-Administered Medications   Medication Dose Route Frequency Provider Last Rate Last Dose  . calcium-vitamin D (OSCAL WITH D) 500-200 MG-UNIT per tablet 1 tablet  1 tablet Oral BID WC 11/03/2016, DO   1 tablet at 11/01/16 0820  . citalopram (CELEXA) tablet 10 mg  10 mg Oral Daily 11/03/16, DO   10 mg at 10/31/16 1017  . donepezil (ARICEPT) tablet 10 mg  10 mg Oral Daily 11/02/16, DO   10 mg at 10/31/16 1018  . folic acid (FOLVITE) tablet 1 mg  1 mg Oral Daily 11/02/16, DO   1 mg at 10/31/16 1018  . heparin ADULT infusion 100 units/mL (25000 units/2108mL sodium chloride 0.45%)  1,200 Units/hr Intravenous Continuous 45m, [facility] 12 mL/hr at 10/31/16 1512 1,200 Units/hr at 10/31/16 1512  . MEDLINE mouth rinse  15 mL Mouth Rinse BID 11/02/16, DO   15 mL at 10/31/16 2203  . [START ON 11/02/2016] methotrexate (RHEUMATREX) tablet 12.5 mg  12.5 mg Oral Weekly 11/04/2016, DO      . sodium chloride flush (NS) 0.9 % injection 3 mL  3 mL Intravenous Q12H Hillary Bow, DO   3 mL at 10/31/16 1019     Discharge Medications: Please see discharge summary for a list of discharge medications.  Relevant Imaging Results:  Relevant Lab Results:   Additional Information SS # 11/02/16  [id number]  Truman Hayward, LCSW

## 2016-11-02 LAB — CBC
HCT: 41.5 % (ref 39.0–52.0)
HEMOGLOBIN: 13.5 g/dL (ref 13.0–17.0)
MCH: 28.2 pg (ref 26.0–34.0)
MCHC: 32.5 g/dL (ref 30.0–36.0)
MCV: 86.6 fL (ref 78.0–100.0)
Platelets: 145 10*3/uL — ABNORMAL LOW (ref 150–400)
RBC: 4.79 MIL/uL (ref 4.22–5.81)
RDW: 14.5 % (ref 11.5–15.5)
WBC: 6.3 10*3/uL (ref 4.0–10.5)

## 2016-11-02 LAB — BASIC METABOLIC PANEL
ANION GAP: 5 (ref 5–15)
BUN: 17 mg/dL (ref 6–20)
CHLORIDE: 106 mmol/L (ref 101–111)
CO2: 27 mmol/L (ref 22–32)
Calcium: 9.1 mg/dL (ref 8.9–10.3)
Creatinine, Ser: 1.04 mg/dL (ref 0.61–1.24)
GFR calc non Af Amer: >60 mL/min (ref >60)
Glucose, Bld: 115 mg/dL — ABNORMAL HIGH (ref 65–99)
POTASSIUM: 4.3 mmol/L (ref 3.5–5.1)
SODIUM: 138 mmol/L (ref 135–145)

## 2016-11-02 LAB — PHOSPHORUS: Phosphorus: 3.8 mg/dL (ref 2.5–4.6)

## 2016-11-02 LAB — HEPARIN LEVEL (UNFRACTIONATED): Heparin Unfractionated: 0.59 IU/mL (ref 0.30–0.70)

## 2016-11-02 LAB — MAGNESIUM: Magnesium: 2 mg/dL (ref 1.7–2.4)

## 2016-11-02 MED ORDER — APIXABAN 5 MG PO TABS
10.0000 mg | ORAL_TABLET | Freq: Two times a day (BID) | ORAL | Status: DC
  Administered 2016-11-02: 10 mg via ORAL
  Filled 2016-11-02: qty 2

## 2016-11-02 MED ORDER — APIXABAN 5 MG PO TABS: 5.0000 mg | ORAL_TABLET | Freq: Two times a day (BID) | ORAL | 0 refills | Status: DC

## 2016-11-02 MED ORDER — APIXABAN 5 MG PO TABS: 10.0000 mg | ORAL_TABLET | Freq: Two times a day (BID) | ORAL | 0 refills | Status: DC

## 2016-11-02 MED ORDER — APIXABAN 5 MG PO TABS: 5.0000 mg | ORAL_TABLET | Freq: Two times a day (BID) | ORAL | Status: DC

## 2016-11-02 NOTE — Progress Notes (Addendum)
ANTICOAGULATION CONSULT NOTE - Follow Up  Pharmacy Consult for IV heparin Indication: pulmonary embolus  Patient Measurements: Height: 5\' 7"  (170.2 cm) Weight: 163 lb 2.3 oz (74 kg) IBW/kg (Calculated) : 66.1 Heparin Dosing Weight: 77  Infusions:  . heparin 1,200 Units/hr (11/02/16 0554)    Assessment: 82 yo male presented to ER with shortness of breath found to have new PE to start IV heparin per pharmacy dosing. Due to heavy clot burden, heparin to be continue for 3 days minimum (per PCCM)   HL 0.59, remains therapeutic  No bleeding or IV complications per RN  Plts low but stable  H/H WNL   Goal of Therapy:  Heparin level 0.3-0.7 units/ml Monitor platelets by anticoagulation protocol: Yes   Plan:   Continue heparin IV infusion at 1200 units/hr  Daily heparin level and CBC   83 RPh 11/02/2016, 8:18 AM Pager [phone]  New consult to start eliquis and d/c heparin   Plan: - eliquis 10mg  po twice daily for 7 days followed by eliquis 5mg  twice daily -stop heparin drip at time of first eliquis dose - pt with dementia, this writer spoke/educated 2 daughters in the room  [phone] RPh 11/02/2016, 12:03 PM Pager [phone]

## 2016-11-02 NOTE — Discharge Instructions (Signed)
Information on my medicine - ELIQUIS (apixaban)  This medication education was reviewed with me or my healthcare representative as part of my discharge preparation.  The pharmacist that spoke with me during my hospital stay was:  Maurice March [facility]  Why was Eliquis prescribed for you? Eliquis was prescribed to treat blood clots that may have been found in the veins of your legs (deep vein thrombosis) or in your lungs (pulmonary embolism) and to reduce the risk of them occurring again.  What do You need to know about Eliquis ? The starting dose is 10 mg (two 5 mg tablets) taken TWICE daily for the FIRST SEVEN (7) DAYS, then on (enter date)  11/09/16 the dose is reduced to ONE 5 mg tablet taken TWICE daily.  Eliquis may be taken with or without food.   Try to take the dose about the same time in the morning and in the evening. If you have difficulty swallowing the tablet whole please discuss with your pharmacist how to take the medication safely.  Take Eliquis exactly as prescribed and DO NOT stop taking Eliquis without talking to the doctor who prescribed the medication.  Stopping may increase your risk of developing a new blood clot.  Refill your prescription before you run out.  After discharge, you should have regular check-up appointments with your healthcare provider that is prescribing your Eliquis.    What do you do if you miss a dose? If a dose of ELIQUIS is not taken at the scheduled time, take it as soon as possible on the same day and twice-daily administration should be resumed. The dose should not be doubled to make up for a missed dose.  Important Safety Information A possible side effect of Eliquis is bleeding. You should call your healthcare provider right away if you experience any of the following: ? Bleeding from an injury or your nose that does not stop. ? Unusual colored urine (red or dark brown) or unusual colored stools (red or black). ? Unusual bruising  for unknown reasons. ? A serious fall or if you hit your head (even if there is no bleeding).  Some medicines may interact with Eliquis and might increase your risk of bleeding or clotting while on Eliquis. To help avoid this, consult your healthcare provider or pharmacist prior to using any new prescription or non-prescription medications, including herbals, vitamins, non-steroidal anti-inflammatory drugs (NSAIDs) and supplements.  This website has more information on Eliquis (apixaban): http://www.eliquis.com/eliquis/home

## 2016-11-02 NOTE — Clinical Social Work Placement (Signed)
   CLINICAL SOCIAL WORK PLACEMENT  NOTE  Date:  11/02/2016  Patient Details  Name: Joseph [name] MRN: [id number] Date of Birth: 03-27-1929  Clinical Social Work is seeking post-discharge placement for this patient at the Skilled  Nursing Facility level of care (*CSW will initial, date and re-position this form in  chart as items are completed):  No   Patient/family provided with [facility] Clinical Social Work Department's list of facilities offering this level of care within the geographic area requested by the patient (or if unable, by the patient's family).  Yes   Patient/family informed of their freedom to choose among providers that offer the needed level of care, that participate in Medicare, Medicaid or managed care program needed by the patient, have an available bed and are willing to accept the patient.  No   Patient/family informed of [facility]'s ownership interest in [facility] and [facility], as well as of the fact that they are under no obligation to receive care at these facilities.  PASRR submitted to EDS on       PASRR number received on       Existing PASRR number confirmed on       FL2 transmitted to all facilities in geographic area requested by pt/family on       FL2 transmitted to all facilities within larger geographic area on 11/01/16     Patient informed that his/her managed care company has contracts with or will negotiate with certain facilities, including the following:        Yes   Patient/family informed of bed offers received.  Patient chooses bed at  ([facility])     Physician recommends and patient chooses bed at      Patient to be transferred to  ([facility]) on 11/02/16.  Patient to be transferred to facility by [facility]     Patient family notified on 11/02/16 of transfer.  Name of family member notified:  DAUGHTER     PHYSICIAN       Additional Comment: Pt / family are in agreement with d/c to [facility]  today. [facility] transport required. Medical necessity form completed. Family is aware out of pocket costs may be associated with [facility] transport. Scripts included in d/c packet. D/C Summary sent to SNF for review. # for report provided to nsg.   _______________________________________________ Royetta Asal, LCSW  [phone] 11/02/2016, 2:38 PM

## 2016-11-02 NOTE — Discharge Summary (Signed)
Physician Discharge Summary  Hirving Ellenbecker UJW:[id number] DOB: 1929/11/03 DOA: 10/30/2016  PCP: Nicolasa Ducking, MD  Admit date: 10/30/2016 Discharge date: 11/02/2016  Admitted From: home Disposition:  SNF  Recommendations for Outpatient Follow-up:  1. F/u with pulmonary and/or hematology for PE 2. Losartan still being held- follow BPs  Discharge Condition:  Full code  CODE STATUS:  DNR   Diet recommendation:  Heart healthy, low sodium Consultations:  PCCM    Discharge Diagnoses:  Principal Problem:   Pulmonary embolism (HCC) Active Problems:   Pulmonary nodules   HTN (hypertension)   Alzheimer's dementia   RA (rheumatoid arthritis) (HCC)    Subjective: No complaint. No bleeding.  Brief Summary: Joseph [name] a 82 y.o.male from home with daughterwith medical history significant of RA, HTN, alzheimer's dementia. Presents to ED after family daughter noted 2 episodes of dyspnea lasting about 3-5 min after he stood up. He does not walk much.  Found to have a PE with a heavy clot burden.   Hospital Course:  Principal Problem:   Pulmonary embolism/ SIRS - HR > 100, RR > 20 - with cardiac strain- Trop max 1.26 and subsequently trending down - not hypoxic at rest - ECHO  Shows akinesis of the apex of the RV - cont Heparin- per PCCM it should be continued for 3 days minimum- further recommendations:  Oral agent decision of NOAC v coumadin - though coumadin can be monitorded overall risk of bleeding with eg: Elquis is lower than Coumadin. He has normal GFR.  - Recommend 6-12 months (longer the better) with reassessment every 3 months for bleeding risk.  - At end of 6-12 months, do half dose NOAc of lower INR goal with coumadin or daily aspirin for another 6-12 months to reduce risk for DVT/PE risk and balance bleeding risk./ Can use d-dimer check to asses risk for recurrent thrombosis - will transition to Eliquis  today on d/c   Active Problems:  Lactic acidosis - due to above - LA 2.31 >> 1.25  Mild thrombocytopenia - likely due to clotting- follow while on anticoagulation    Pulmonary nodules - no smoking history- ?rheumatiod    HTN (hypertension) - Losartan has been on hold due to right heart strain    Alzheimer's dementia - Aricept    RA (rheumatoid arthritis)  - Methotrexate  Frequent falls - hip fracture last month - daughter had plans for him to be transitioned to [facility] nursing facility hopefully next week- will see if he can go there from here   Discharge Instructions  Discharge Instructions    Diet - low sodium heart healthy    Complete by:  As directed    Increase activity slowly    Complete by:  As directed        Medication List    TAKE these medications   apixaban 5 MG Tabs tablet Commonly known as:  ELIQUIS Take 2 tablets (10 mg total) by mouth 2 (two) times daily.   apixaban 5 MG Tabs tablet Commonly known as:  ELIQUIS Take 1 tablet (5 mg total) by mouth 2 (two) times daily. Start after 10 mg tabs are finished Start taking on:  11/09/2016   calcium-vitamin D 500-200 MG-UNIT tablet Commonly known as:  OSCAL WITH D Take 1 tablet by mouth 2 (two) times daily.   citalopram 10 MG tablet Commonly known as:  CELEXA Take 10 mg by mouth daily.   cyanocobalamin 1000 MCG tablet Take 1,000 mcg by mouth daily.   donepezil 10  MG tablet Commonly known as:  ARICEPT Take 10 mg by mouth daily.   folic acid 1 MG tablet Commonly known as:  FOLVITE Take 1 mg by mouth daily.   losartan 100 MG tablet Commonly known as:  COZAAR Take 100 mg by mouth daily.   methotrexate 2.5 MG tablet Commonly known as:  RHEUMATREX Take 12.5 mg by mouth once a week. Caution:Chemotherapy. Protect from light.  Pt takes on Tuesday evenings... Was changed by MD- per pts daughter       Allergies  Allergen Reactions  . Penicillins Other (See Comments)    Family  thinks pt is allergic to penicillin but not sure      Procedures/Studies:  Dg Chest 2 View  Result Date: 10/30/2016 CLINICAL DATA:  Acute onset of shortness of breath approximately 1 hour ago. EXAM: CHEST  2 VIEW COMPARISON:  10/15/2016 FINDINGS: Heart size is within normal limits.  Aortic atherosclerosis. Chronic elevation of left hemidiaphragm is stable. Both lungs are clear. No evidence of pneumothorax or pleural effusion. IMPRESSION: Stable chronic elevation of left hemidiaphragm. No active lung disease. Aortic atherosclerosis. Electronically Signed   By: Myles Rosenthal M.D.   On: 10/30/2016 15:52   Ct Angio Chest Pe W And/or Wo Contrast  Result Date: 10/30/2016 CLINICAL DATA:  Sub onset shortness of breath. EXAM: CT ANGIOGRAPHY CHEST WITH CONTRAST TECHNIQUE: Multidetector CT imaging of the chest was performed using the standard protocol during bolus administration of intravenous contrast. Multiplanar CT image reconstructions and MIPs were obtained to evaluate the vascular anatomy. CONTRAST:  100 cc Isovue 370. COMPARISON:  None. FINDINGS: Cardiovascular: There are filling defects in the pulmonary arteries bilaterally with the most proximal clot is seen in the distal right and left main pulmonary arteries. RV/LV ratio is 1.9. Atherosclerotic calcification of the arterial vasculature, including coronary arteries. Heart size normal. No pericardial effusion. Mediastinum/Nodes: No pathologically enlarged mediastinal, hilar or axillary lymph nodes. Esophagus is grossly unremarkable. Lungs/Pleura: Image quality is degraded by respiratory motion. A few scattered pulmonary nodules measure 4 mm or less in size. Mild volume loss of the base of the left hemi thorax, adjacent to an elevated left hemidiaphragm. No pleural fluid. Airway is unremarkable. Upper Abdomen: Sub cm low-attenuation lesion in the left hepatic lobe is too small to characterize. Visualized portions of the liver, gallbladder, adrenal glands,  left kidney, spleen, pancreas, stomach and bowel are otherwise grossly unremarkable. Musculoskeletal: No worrisome lytic or sclerotic lesions. Degenerative changes are seen in the spine. Review of the MIP images confirms the above findings. IMPRESSION: 1. Positive for acute PE with CT evidence of right heart strain (RV/LV Ratio = 1.9) consistent with at least submassive (intermediate risk) PE. The presence of right heart strain has been associated with an increased risk of morbidity and mortality. Please activate Code PE by paging [phone]. Critical Value/emergent results were called by telephone at the time of interpretation on 10/30/2016 at 6:05 pm to Dr. Crista Curb , who verbally acknowledged these results. 2. Aortic atherosclerosis (ICD10-170.0). Coronary artery calcification. 3. Scattered pulmonary nodules measure 4 mm or less in size. No follow-up needed if patient is low-risk (and has no known or suspected primary neoplasm). Non-contrast chest C in T can be considered in 12 months if patient is high-risk. This recommendation follows the consensus statement: Guidelines for Management of Incidental Pulmonary Nodules Detected on CT Images: From the Fleischner Society 2017; Radiology 2017; 284:228-243. Electronically Signed   By: Leanna Battles M.D.   On: 10/30/2016 18:10  Discharge Exam: Vitals:   11/01/16 2143 11/02/16 0534  BP: (!) 155/74 (!) 144/89  Pulse: 76 75  Resp: 16 18  Temp: 97.6 F (36.4 C) 98.5 F (36.9 C)   Vitals:   11/01/16 0413 11/01/16 1520 11/01/16 2143 11/02/16 0534  BP: 131/71 133/88 (!) 155/74 (!) 144/89  Pulse: 78 84 76 75  Resp: 18 18 16 18   Temp: 97 F (36.1 C) 98.4 F (36.9 C) 97.6 F (36.4 C) 98.5 F (36.9 C)  TempSrc: Oral Oral Oral Oral  SpO2: 95% 97% 97% 96%  Weight:      Height:        General: Pt is alert, awake, not in acute distress Cardiovascular: RRR, S1/S2 +, no rubs, no gallops Respiratory: CTA bilaterally, no wheezing, no  rhonchi Abdominal: Soft, NT, ND, bowel sounds + Extremities: no edema, no cyanosis    The results of significant diagnostics from this hospitalization (including imaging, microbiology, ancillary and laboratory) are listed below for reference.     Microbiology: Recent Results (from the past 240 hour(s))  MRSA PCR Screening     Status: None   Collection Time: 10/30/16 10:05 PM  Result Value Ref Range Status   MRSA by PCR NEGATIVE NEGATIVE Final    Comment:        The GeneXpert MRSA Assay (FDA approved for NASAL specimens only), is one component of a comprehensive MRSA colonization surveillance program. It is not intended to diagnose MRSA infection nor to guide or monitor treatment for MRSA infections.      Labs: BNP (last 3 results)  Recent Labs  10/30/16 1500  BNP 20.2   Basic Metabolic Panel:  Recent Labs Lab 10/30/16 1504 10/31/16 0328 11/01/16 0353 11/02/16 0403  NA 136 136  --  138  K 4.1 4.3  --  4.3  CL 102 105  --  106  CO2 24 24  --  27  GLUCOSE 167* 133*  --  115*  BUN 17 16  --  17  CREATININE 0.92 0.91  --  1.04  CALCIUM 9.0 8.7*  --  9.1  MG  --   --  1.9 2.0  PHOS  --   --  3.2 3.8   Liver Function Tests:  Recent Labs Lab 10/30/16 1504  AST 30  ALT 47  ALKPHOS 115  BILITOT 0.7  PROT 7.1  ALBUMIN 3.6   No results for input(s): LIPASE, AMYLASE in the last 168 hours. No results for input(s): AMMONIA in the last 168 hours. CBC:  Recent Labs Lab 10/30/16 1504 10/31/16 0328 11/01/16 0353 11/02/16 0403  WBC 12.8* 8.5 6.5 6.3  NEUTROABS 11.1*  --   --   --   HGB 14.6 13.5 12.9* 13.5  HCT 44.7 41.1 39.5 41.5  MCV 87.1 87.3 87.0 86.6  PLT 166 149* 135* 145*   Cardiac Enzymes:  Recent Labs Lab 10/30/16 2052 10/31/16 0328 10/31/16 0804  TROPONINI 1.26* 0.65* 0.41*   BNP: Invalid input(s): POCBNP CBG: No results for input(s): GLUCAP in the last 168 hours. D-Dimer No results for input(s): DDIMER in the last 72 hours. Hgb  A1c No results for input(s): HGBA1C in the last 72 hours. Lipid Profile No results for input(s): CHOL, HDL, LDLCALC, TRIG, CHOLHDL, LDLDIRECT in the last 72 hours. Thyroid function studies No results for input(s): TSH, T4TOTAL, T3FREE, THYROIDAB in the last 72 hours.  Invalid input(s): FREET3 Anemia work up No results for input(s): VITAMINB12, FOLATE, FERRITIN, TIBC, IRON, RETICCTPCT in the  last 72 hours. Urinalysis    Component Value Date/Time   COLORURINE YELLOW 10/30/2016 1630   APPEARANCEUR CLEAR 10/30/2016 1630   LABSPEC 1.011 10/30/2016 1630   PHURINE 5.5 10/30/2016 1630   GLUCOSEU NEGATIVE 10/30/2016 1630   HGBUR NEGATIVE 10/30/2016 1630   BILIRUBINUR NEGATIVE 10/30/2016 1630   KETONESUR NEGATIVE 10/30/2016 1630   PROTEINUR NEGATIVE 10/30/2016 1630   NITRITE NEGATIVE 10/30/2016 1630   LEUKOCYTESUR NEGATIVE 10/30/2016 1630   Sepsis Labs Invalid input(s): PROCALCITONIN,  WBC,  LACTICIDVEN Microbiology Recent Results (from the past 240 hour(s))  MRSA PCR Screening     Status: None   Collection Time: 10/30/16 10:05 PM  Result Value Ref Range Status   MRSA by PCR NEGATIVE NEGATIVE Final    Comment:        The GeneXpert MRSA Assay (FDA approved for NASAL specimens only), is one component of a comprehensive MRSA colonization surveillance program. It is not intended to diagnose MRSA infection nor to guide or monitor treatment for MRSA infections.      Time coordinating discharge: Over 30 minutes  SIGNED:   Calvert Cantor, MD  [facility] 11/02/2016, 1:27 PM Pager   If 7PM-7AM, please contact night-coverage [email or web address] Password [email or web address]

## 2016-11-03 ENCOUNTER — Non-Acute Institutional Stay (SKILLED_NURSING_FACILITY): Payer: Medicare Other | Admitting: Adult Health

## 2016-11-03 ENCOUNTER — Encounter: Payer: Self-pay | Admitting: Adult Health

## 2016-11-03 DIAGNOSIS — F0280 Dementia in other diseases classified elsewhere without behavioral disturbance: Secondary | ICD-10-CM | POA: Diagnosis not present

## 2016-11-03 DIAGNOSIS — I10 Essential (primary) hypertension: Secondary | ICD-10-CM | POA: Diagnosis not present

## 2016-11-03 DIAGNOSIS — F329 Major depressive disorder, single episode, unspecified: Secondary | ICD-10-CM

## 2016-11-03 DIAGNOSIS — G309 Alzheimer's disease, unspecified: Secondary | ICD-10-CM

## 2016-11-03 DIAGNOSIS — M069 Rheumatoid arthritis, unspecified: Secondary | ICD-10-CM | POA: Diagnosis not present

## 2016-11-03 DIAGNOSIS — I2699 Other pulmonary embolism without acute cor pulmonale: Secondary | ICD-10-CM

## 2016-11-03 DIAGNOSIS — R918 Other nonspecific abnormal finding of lung field: Secondary | ICD-10-CM

## 2016-11-03 DIAGNOSIS — F32A Depression, unspecified: Secondary | ICD-10-CM | POA: Insufficient documentation

## 2016-11-03 NOTE — Progress Notes (Signed)
Patient ID: Joseph [name], male   DOB: 1929-11-27, 82 y.o.   MRN: [id number]   Location:   [facility] Room Number: 121-A Place of Service:  SNF (31)   CODE STATUS: Full Code until otherwise determined  Allergies  Allergen Reactions  . Penicillins Other (See Comments)    Family thinks pt is allergic to penicillin but not sure     Chief Complaint  Patient presents with  . Hospitalization Follow-up    Follow up    HPI:  He had been living at home with family ; has shortness of breath; went to ED. He was found to have a pulmonary embolism.  He is here for short term rehab; his goal is to return back home. He cannot fully participate in the ros; but did tell me that he is feeling good and has no pain.    Past Medical History:  Diagnosis Date  . Alzheimer's dementia   . Hypertension   . RA (rheumatoid arthritis) (HCC)     Past Surgical History:  Procedure Laterality Date  . JOINT REPLACEMENT     left hip replacement 09-19-16    Social History   Social History  . Marital status: Single    Spouse name: N/A  . Number of children: N/A  . Years of education: N/A   Occupational History  . Not on file.   Social History Main Topics  . Smoking status: Never Smoker  . Smokeless tobacco: Never Used  . Alcohol use No  . Drug use: No  . Sexual activity: No   Other Topics Concern  . Not on file   Social History Narrative  . No narrative on file   Family History  Problem Relation Age of Onset  . Other Father     On coumadin      VITAL SIGNS BP (!) 150/86   Pulse (!) 101   Temp 98.9 F (37.2 C) (Oral)   Resp 20   Ht 5\' 7"  (1.702 m)   Wt 163 lb (73.9 kg)   SpO2 95%   BMI 25.53 kg/m   Patient's Medications  New Prescriptions   No medications on file  Previous Medications   APIXABAN (ELIQUIS) 5 MG TABS TABLET    Take 2 tablets (10 mg total) by mouth 2 (two) times daily.   APIXABAN (ELIQUIS) 5 MG TABS TABLET    Take 1 tablet (5 mg total) by mouth  2 (two) times daily. Start after 10 mg tabs are finished   CALCIUM-VITAMIN D (OSCAL WITH D) 500-200 MG-UNIT TABLET    Take 1 tablet by mouth 2 (two) times daily.   CITALOPRAM (CELEXA) 10 MG TABLET    Take 10 mg by mouth daily.   CYANOCOBALAMIN 1000 MCG TABLET    Take 1,000 mcg by mouth daily.   DONEPEZIL (ARICEPT) 10 MG TABLET    Take 10 mg by mouth daily.   FOLIC ACID (FOLVITE) 1 MG TABLET    Take 1 mg by mouth daily.   LOSARTAN (COZAAR) 100 MG TABLET    Take 100 mg by mouth daily.   METHOTREXATE (RHEUMATREX) 2.5 MG TABLET    Take 12.5 mg by mouth once a week. Caution:Chemotherapy. Protect from light.  Pt takes on Tuesday evenings... Was changed by MD- per pts daughter  Modified Medications   No medications on file  Discontinued Medications   No medications on file     SIGNIFICANT DIAGNOSTIC EXAMS  10-30-16: chest x-ray: Stable chronic elevation of  left hemidiaphragm. No active lung disease. Aortic atherosclerosis.  10-30-16: ct angio of chest: 1. Positive for acute PE with CT evidence of right heart strain (RV/LV Ratio = 1.9) consistent with at least submassive (intermediate risk) PE. The presence of right heart strain has been associated with an increased risk of morbidity and mortality.  2. Aortic atherosclerosis (ICD10-170.0). Coronary artery calcification. 3. Scattered pulmonary nodules measure 4 mm or less in size. No follow-up needed if patient is low-risk (and has no known or suspected primary neoplasm). Non-contrast chest C in T can be considered in 12 months if patient is high-risk.    10-31-16: 2-d echo:   - Left ventricle: The cavity size was normal. Wall thickness was increased in a pattern of mild LVH. Systolic function was normal. The estimated ejection fraction was in the range of 55% to 60%. Wall motion was normal; there were no regional wall motion abnormalities. Doppler parameters are consistent with abnormal left ventricular relaxation (grade 1 diastolic  dysfunction). - Right ventricle: Hypokinesis of the RV apex. - Pulmonary arteries: Systolic pressure was mildly increased. PA   peak pressure: 43 mm Hg (S). Impressions: - Normal LV systolic function; grade 1 diastolic dysfunction; mild  TR; akinesis of the apical RV consistent with Mcconnell&'s sign (suggestive of pulmonary embolus).    LABS REVIEWED:   10-30-16: wbc 12.8; hgb 14.6; hct 44.7; mcv 87.1; plt 166; glucose 167; bun 17; creat 0.92; k+ 4.1 na++ 136; liver normal albumin 3.6 11-02-16: wbc 6.3; hgb 13.5; hct 41.5 ;mcv 86.6; plt 145; glucose 115; bun 17; creat 1.04; k+ 4.3; na++ 138; mag 2.0; phos 3.8  Review of Systems  Respiratory: Negative for shortness of breath.   Cardiovascular: Negative for chest pain.  Gastrointestinal: Negative for abdominal pain.  Musculoskeletal: Negative for back pain and joint pain.  Skin: Negative.   Neurological: Negative for dizziness.  Psychiatric/Behavioral: The patient is not nervous/anxious.      Physical Exam  Constitutional: He appears well-developed and well-nourished. No distress.  Eyes: Conjunctivae are normal.  Neck: Neck supple. No JVD present. No thyromegaly present.  Cardiovascular: Normal rate, regular rhythm and intact distal pulses.   Respiratory: Effort normal and breath sounds normal. No respiratory distress. He has no wheezes.  GI: Soft. Bowel sounds are normal. He exhibits no distension. There is no tenderness.  Musculoskeletal: He exhibits no edema.  Able to move all extremities   Lymphadenopathy:    He has no cervical adenopathy.  Neurological: He is alert.  Skin: Skin is warm and dry. He is not diaphoretic.  Psychiatric: He has a normal mood and affect.    ASSESSMENT/ PLAN:  1. Hypertension: will continue cozaar 100 mg daily   2. Alzheimer's disease: is presently without change; will continue aricept 10 mg nightly and will monitor  3. RA; will continue methotrexate 12.5 mg weekly takes folic acid 1 mg  daily   4. Depression: will continue celexa 10 mg daily   5.  Pulmonary embolism: EF 55-60% (10-31-16); is on eliquis 10 mg twice daily through 11-09-16; then 5 mg twice daily will need this therapy for 12 months.   6. Pulmonary nodules: scattered less than 4 mm will monitor    Will check cbc; cmp next week   Time spent with patient 50   minutes >50% time spent counseling; reviewing medical record; tests; labs; and developing future plan of care     MD is aware of resident's narcotic use and is in agreement with current plan  of care. We will attempt to wean resident as apropriate   Synthia Innocent NP [facility]  Contact [phone] Monday through Friday 8am- 5pm  After hours call [phone]

## 2016-11-08 ENCOUNTER — Encounter: Payer: Self-pay | Admitting: Internal Medicine

## 2016-11-08 ENCOUNTER — Non-Acute Institutional Stay (SKILLED_NURSING_FACILITY): Payer: Medicare Other | Admitting: Internal Medicine

## 2016-11-08 DIAGNOSIS — R918 Other nonspecific abnormal finding of lung field: Secondary | ICD-10-CM | POA: Diagnosis not present

## 2016-11-08 DIAGNOSIS — I2699 Other pulmonary embolism without acute cor pulmonale: Secondary | ICD-10-CM | POA: Diagnosis not present

## 2016-11-08 DIAGNOSIS — M069 Rheumatoid arthritis, unspecified: Secondary | ICD-10-CM | POA: Diagnosis not present

## 2016-11-08 DIAGNOSIS — L989 Disorder of the skin and subcutaneous tissue, unspecified: Secondary | ICD-10-CM | POA: Diagnosis not present

## 2016-11-08 DIAGNOSIS — F329 Major depressive disorder, single episode, unspecified: Secondary | ICD-10-CM

## 2016-11-08 DIAGNOSIS — G309 Alzheimer's disease, unspecified: Secondary | ICD-10-CM | POA: Diagnosis not present

## 2016-11-08 DIAGNOSIS — R5381 Other malaise: Secondary | ICD-10-CM

## 2016-11-08 DIAGNOSIS — F0280 Dementia in other diseases classified elsewhere without behavioral disturbance: Secondary | ICD-10-CM | POA: Diagnosis not present

## 2016-11-08 DIAGNOSIS — F32A Depression, unspecified: Secondary | ICD-10-CM

## 2016-11-08 DIAGNOSIS — H6191 Disorder of right external ear, unspecified: Secondary | ICD-10-CM

## 2016-11-08 DIAGNOSIS — I10 Essential (primary) hypertension: Secondary | ICD-10-CM

## 2016-11-08 LAB — HEPATIC FUNCTION PANEL
ALK PHOS: 119 U/L (ref 25–125)
ALT: 57 U/L — AB (ref 10–40)
AST: 17 U/L (ref 14–40)
BILIRUBIN, TOTAL: 0.3 mg/dL

## 2016-11-08 LAB — CBC AND DIFFERENTIAL
HEMATOCRIT: 43 % (ref 41–53)
Hemoglobin: 14 g/dL (ref 13.5–17.5)
PLATELETS: 176 10*3/uL (ref 150–399)
WBC: 4.6 10*3/mL

## 2016-11-08 LAB — BASIC METABOLIC PANEL
BUN: 15 mg/dL (ref 4–21)
Creatinine: 0.9 mg/dL (ref 0.6–1.3)
Glucose: 155 mg/dL
Potassium: 4.4 mmol/L (ref 3.4–5.3)
Sodium: 135 mmol/L — AB (ref 137–147)

## 2016-11-08 NOTE — Progress Notes (Signed)
Patient ID: Joseph [name], male   DOB: September 28, 1929, 82 y.o.   MRN: [id number]    HISTORY AND PHYSICAL   DATE: 11/08/2016  Location:    [facility] Room Number: 256 A Place of Service: SNF (31)   Extended Emergency Contact Information Primary Emergency Contact: Caviness,Carol Address: [address], [place] [address] [place] Phone: [phone] Relation: Other  Advanced Directive information Does Patient Have a Medical Advance Directive?: Yes, Type of Advance Directive: Out of facility DNR (pink MOST or yellow form)  Chief Complaint  Patient presents with  . New Admit To SNF    HPI:  82 yo male seen today as a new admission into SNF following hospital stay for PE, pulmonary nodules, HTN, RA and Alzheimer's dementia. He presented to ED with dyspnea. CTA chest revealed acute PE with right heart strain; scattered pulmonary nodules (19m or less); aortic atherosclerosis and coronary calcification. 2D echo showed akinesis of RV apex. He was placed on Heparin gtt-->po eliquis which he will need to take at least 6-12 mos. Losartan held due to right heart strain. Hgb 14.6-->13.5; Plts 166K-->145K; WBC 12.8K-->6.3K; abs neutrophils 11.1; albumin 3.6; Cr 1.04 at d/c. He presents to SNF for short term rehab with potential for long term care  Today, he reports no concerns. No SOB or DOE. He is tolerating tx. No bleeding episodes. Sleeps and eats well. No falls. Daughter, CArbie Cookey present and reports pt with worsening dementia and she is unable to safely care for him at home. She is considering long term care. He is a poor historian due to dementia. Hx obtained from chart  Hypertension - BP elevated. He takes cozaar 100 mg daily   Alzheimer's disease - stable on aricept 10 mg nightly  RA - stable on methotrexate 12.5 mg weekly. takes folic acid 1 mg daily. Followed by rheumatology  Depression - mood stable on celexa 10 mg daily    Past Medical History:    Diagnosis Date  . Alzheimer's dementia   . Hypertension   . RA (rheumatoid arthritis) ([place]     Past Surgical History:  Procedure Laterality Date  . JOINT REPLACEMENT     left hip replacement 09-19-16    Patient Care Team: WChriss Czar MD as PCP - General (Family Medicine)  Social History   Social History  . Marital status: Single    Spouse name: N/A  . Number of children: N/A  . Years of education: N/A   Occupational History  . Not on file.   Social History Main Topics  . Smoking status: Never Smoker  . Smokeless tobacco: Never Used  . Alcohol use No  . Drug use: No  . Sexual activity: No   Other Topics Concern  . Not on file   Social History Narrative  . No narrative on file     reports that he has never smoked. He has never used smokeless tobacco. He reports that he does not drink alcohol or use drugs.  Family History  Problem Relation Age of Onset  . Other Father     On coumadin   Family Status  Relation Status  . Father      There is no immunization history on file for this patient.  Allergies  Allergen Reactions  . Penicillins Other (See Comments)    Family thinks pt is allergic to penicillin but not sure     Medications: Patient's Medications  New Prescriptions   No medications on file  Previous Medications   APIXABAN (ELIQUIS) 5 MG TABS TABLET    Take 2 tablets (10 mg total) by mouth 2 (two) times daily.   APIXABAN (ELIQUIS) 5 MG TABS TABLET    Take 1 tablet (5 mg total) by mouth 2 (two) times daily. Start after 10 mg tabs are finished   CALCIUM-VITAMIN D (OSCAL WITH D) 500-200 MG-UNIT TABLET    Take 1 tablet by mouth 2 (two) times daily.   CITALOPRAM (CELEXA) 10 MG TABLET    Take 10 mg by mouth daily.   CYANOCOBALAMIN 1000 MCG TABLET    Take 1,000 mcg by mouth daily.   DONEPEZIL (ARICEPT) 10 MG TABLET    Take 10 mg by mouth daily.   FOLIC ACID (FOLVITE) 1 MG TABLET    Take 1 mg by mouth daily.   LOSARTAN (COZAAR) 100 MG TABLET     Take 100 mg by mouth daily.   METHOTREXATE (RHEUMATREX) 2.5 MG TABLET    Take 12.5 mg by mouth once a week. Caution:Chemotherapy. Protect from light.  Pt takes on Tuesday evenings... Was changed by MD- per pts daughter  Modified Medications   No medications on file  Discontinued Medications   No medications on file    Review of Systems  Unable to perform ROS: Dementia    Vitals:   11/08/16 0937  BP: (!) 150/86  Pulse: (!) 101  Resp: 20  Temp: 98.9 F (37.2 C)  TempSrc: Oral  SpO2: 95%  Weight: 172 lb 12.8 oz (78.4 kg)  Height: 5' 7"  (1.702 m)   Body mass index is 27.06 kg/m.  Physical Exam  Constitutional: He appears well-developed and well-nourished.  Looks well in NAD, sitting in w/c at bedside  HENT:  Ears:  Mouth/Throat: Oropharynx is clear and moist.  MMM. No oral thrush  Eyes: Pupils are equal, round, and reactive to light. No scleral icterus.  Neck: Neck supple. Carotid bruit is not present. No thyromegaly present.  Cardiovascular: Normal rate, regular rhythm, normal heart sounds and intact distal pulses.  Exam reveals no gallop and no friction rub.   No murmur heard. no distal LE swelling. No calf TTP  Pulmonary/Chest: Effort normal and breath sounds normal. He has no wheezes. He has no rales. He exhibits no tenderness.  Abdominal: Soft. Bowel sounds are normal. He exhibits no distension, no abdominal bruit, no pulsatile midline mass and no mass. There is no hepatomegaly. There is no tenderness. There is no rebound and no guarding.  Musculoskeletal: He exhibits edema.  Lymphadenopathy:    He has no cervical adenopathy.  Neurological: He is alert.  Skin: Skin is warm and dry. Lesion (right ear, bleeding) noted. No rash noted.  Psychiatric: He has a normal mood and affect. His behavior is normal. Thought content normal.     Labs reviewed: Admission on 10/30/2016, Discharged on 11/02/2016  Component Date Value Ref Range Status  . WBC 10/30/2016 12.8* 4.0 -  10.5 K/uL Final  . RBC 10/30/2016 5.13  4.22 - 5.81 MIL/uL Final  . Hemoglobin 10/30/2016 14.6  13.0 - 17.0 g/dL Final  . HCT 10/30/2016 44.7  39.0 - 52.0 % Final  . MCV 10/30/2016 87.1  78.0 - 100.0 fL Final  . MCH 10/30/2016 28.5  26.0 - 34.0 pg Final  . MCHC 10/30/2016 32.7  30.0 - 36.0 g/dL Final  . RDW 10/30/2016 14.7  11.5 - 15.5 % Final  . Platelets 10/30/2016 166  150 - 400 K/uL Final  . Neutrophils Relative % 10/30/2016 87  % Final  . Neutro Abs 10/30/2016 11.1* 1.7 - 7.7 K/uL Final  . Lymphocytes Relative 10/30/2016 6  % Final  . Lymphs Abs 10/30/2016 0.8  0.7 - 4.0 K/uL Final  . Monocytes Relative 10/30/2016 6  % Final  . Monocytes Absolute 10/30/2016 0.8  0.1 - 1.0 K/uL Final  . Eosinophils Relative 10/30/2016 1  % Final  . Eosinophils Absolute 10/30/2016 0.1  0.0 - 0.7 K/uL Final  . Basophils Relative 10/30/2016 0  % Final  . Basophils Absolute 10/30/2016 0.0  0.0 - 0.1 K/uL Final  . Sodium 10/30/2016 136  135 - 145 mmol/L Final  . Potassium 10/30/2016 4.1  3.5 - 5.1 mmol/L Final  . Chloride 10/30/2016 102  101 - 111 mmol/L Final  . CO2 10/30/2016 24  22 - 32 mmol/L Final  . Glucose, Bld 10/30/2016 167* 65 - 99 mg/dL Final  . BUN 10/30/2016 17  6 - 20 mg/dL Final  . Creatinine, Ser 10/30/2016 0.92  0.61 - 1.24 mg/dL Final  . Calcium 10/30/2016 9.0  8.9 - 10.3 mg/dL Final  . Total Protein 10/30/2016 7.1  6.5 - 8.1 g/dL Final  . Albumin 10/30/2016 3.6  3.5 - 5.0 g/dL Final  . AST 10/30/2016 30  15 - 41 U/L Final  . ALT 10/30/2016 47  17 - 63 U/L Final  . Alkaline Phosphatase 10/30/2016 115  38 - 126 U/L Final  . Total Bilirubin 10/30/2016 0.7  0.3 - 1.2 mg/dL Final  . GFR calc non Af Amer 10/30/2016 >60  >60 mL/min Final  . GFR calc Af Amer 10/30/2016 >60  >60 mL/min Final   Comment: (NOTE) The eGFR has been calculated using the CKD EPI equation. This calculation has not been validated in all clinical situations. eGFR's persistently <60 mL/min signify possible Chronic  Kidney Disease.   . Anion gap 10/30/2016 10  5 - 15 Final  . Color, Urine 10/30/2016 YELLOW  YELLOW Final  . APPearance 10/30/2016 CLEAR  CLEAR Final  . Specific Gravity, Urine 10/30/2016 1.011  1.005 - 1.030 Final  . pH 10/30/2016 5.5  5.0 - 8.0 Final  . Glucose, UA 10/30/2016 NEGATIVE  NEGATIVE mg/dL Final  . Hgb urine dipstick 10/30/2016 NEGATIVE  NEGATIVE Final  . Bilirubin Urine 10/30/2016 NEGATIVE  NEGATIVE Final  . Ketones, ur 10/30/2016 NEGATIVE  NEGATIVE mg/dL Final  . Protein, ur 10/30/2016 NEGATIVE  NEGATIVE mg/dL Final  . Nitrite 10/30/2016 NEGATIVE  NEGATIVE Final  . Leukocytes, UA 10/30/2016 NEGATIVE  NEGATIVE Final  . Troponin i, poc 10/30/2016 0.73* 0.00 - 0.08 ng/mL Final  . Comment 10/30/2016 NOTIFIED PHYSICIAN   Final  . Comment 3 10/30/2016          Final   Comment: Due to the release kinetics of cTnI, a negative result within the first hours of the onset of symptoms does not rule out myocardial infarction with certainty. If myocardial infarction is still suspected, repeat the test at appropriate intervals.   . Lactic Acid, Venous 10/30/2016 2.31* 0.5 - 1.9 mmol/L Final  . Comment 10/30/2016 NOTIFIED PHYSICIAN   Final  . Lactic Acid, Venous 10/30/2016 1.25  0.5 - 1.9 mmol/L Final  . B Natriuretic Peptide 10/30/2016 20.2  0.0 - 100.0 pg/mL Final  . Heparin Unfractionated 10/31/2016 0.68  0.30 - 0.70 IU/mL Final   Comment:        IF HEPARIN RESULTS ARE BELOW EXPECTED VALUES, AND  PATIENT DOSAGE HAS BEEN CONFIRMED, SUGGEST FOLLOW UP TESTING OF ANTITHROMBIN III LEVELS.   . WBC 10/31/2016 8.5  4.0 - 10.5 K/uL Final  . RBC 10/31/2016 4.71  4.22 - 5.81 MIL/uL Final  . Hemoglobin 10/31/2016 13.5  13.0 - 17.0 g/dL Final  . HCT 10/31/2016 41.1  39.0 - 52.0 % Final  . MCV 10/31/2016 87.3  78.0 - 100.0 fL Final  . MCH 10/31/2016 28.7  26.0 - 34.0 pg Final  . MCHC 10/31/2016 32.8  30.0 - 36.0 g/dL Final  . RDW 10/31/2016 14.7  11.5 - 15.5 % Final  . Platelets  10/31/2016 149* 150 - 400 K/uL Final  . aPTT 10/30/2016 91* 24 - 36 seconds Final   Comment:        IF BASELINE aPTT IS ELEVATED, SUGGEST PATIENT RISK ASSESSMENT BE USED TO DETERMINE APPROPRIATE ANTICOAGULANT THERAPY.   . Sodium 10/31/2016 136  135 - 145 mmol/L Final  . Potassium 10/31/2016 4.3  3.5 - 5.1 mmol/L Final  . Chloride 10/31/2016 105  101 - 111 mmol/L Final  . CO2 10/31/2016 24  22 - 32 mmol/L Final  . Glucose, Bld 10/31/2016 133* 65 - 99 mg/dL Final  . BUN 10/31/2016 16  6 - 20 mg/dL Final  . Creatinine, Ser 10/31/2016 0.91  0.61 - 1.24 mg/dL Final  . Calcium 10/31/2016 8.7* 8.9 - 10.3 mg/dL Final  . GFR calc non Af Amer 10/31/2016 >60  >60 mL/min Final  . GFR calc Af Amer 10/31/2016 >60  >60 mL/min Final   Comment: (NOTE) The eGFR has been calculated using the CKD EPI equation. This calculation has not been validated in all clinical situations. eGFR's persistently <60 mL/min signify possible Chronic Kidney Disease.   . Anion gap 10/31/2016 7  5 - 15 Final  . Troponin I 10/30/2016 1.26* <0.03 ng/mL Final   Comment: RESULT REPEATED AND VERIFIED CRITICAL RESULT CALLED TO, READ BACK BY AND VERIFIED WITH: A KROLICZAK RN @ [id number] ON 20/10/07 BY C DAVIS   . Troponin I 10/31/2016 0.65* <0.03 ng/mL Final  . Troponin I 10/31/2016 0.41* <0.03 ng/mL Final  . Weight 10/31/2016 2610.25  oz Final  . Height 10/31/2016 67  in Final  . BP 10/31/2016 136/83  mmHg Final  . LV PW d 10/31/2016 11.9* 0.6 - 1.1 mm Final  . FS 10/31/2016 32  28 - 44 % Final  . LA vol 10/31/2016 40.3  mL Final  . LA ID, A-P, ES 10/31/2016 31  mm Final  . IVS/LV PW RATIO, ED 10/31/2016 .97   Final  . Reg peak vel 10/31/2016 315  cm/s Final  . RV sys press 10/31/2016 43  mmHg Final  . LV e' LATERAL 10/31/2016 6.53  cm/s Final  . LV E/e' medial 10/31/2016 9.66   Final  . LV E/e'average 10/31/2016 9.66   Final  . LA diam index 10/31/2016 1.65  cm/m2 Final  . LA vol A4C 10/31/2016 35.2  ml Final  . LVOT  diameter 10/31/2016 21  mm Final  . LVOT area 10/31/2016 3.46  cm2 Final  . E/e' ratio 10/31/2016 9.66   Final  . MV pk E vel 10/31/2016 63.1  m/s Final  . TR max vel 10/31/2016 315  cm/s Final  . MV pk A vel 10/31/2016 120  m/s Final  . LA vol index 10/31/2016 21.4  mL/m2 Final  . LA diam end sys 10/31/2016 31.00  mm Final  . TDI e' medial 10/31/2016 5.55  Final  . TDI e' lateral 10/31/2016 6.53   Final  . Lateral S' vel 10/31/2016 14.60  cm/sec Final  . TAPSE 10/31/2016 15.60  mm Final  . MRSA by PCR 10/30/2016 NEGATIVE  NEGATIVE Final   Comment:        The GeneXpert MRSA Assay (FDA approved for NASAL specimens only), is one component of a comprehensive MRSA colonization surveillance program. It is not intended to diagnose MRSA infection nor to guide or monitor treatment for MRSA infections.   . Heparin Unfractionated 10/31/2016 0.51  0.30 - 0.70 IU/mL Final   Comment:        IF HEPARIN RESULTS ARE BELOW EXPECTED VALUES, AND PATIENT DOSAGE HAS BEEN CONFIRMED, SUGGEST FOLLOW UP TESTING OF ANTITHROMBIN III LEVELS.   . WBC 11/01/2016 6.5  4.0 - 10.5 K/uL Final  . RBC 11/01/2016 4.54  4.22 - 5.81 MIL/uL Final  . Hemoglobin 11/01/2016 12.9* 13.0 - 17.0 g/dL Final  . HCT 11/01/2016 39.5  39.0 - 52.0 % Final  . MCV 11/01/2016 87.0  78.0 - 100.0 fL Final  . MCH 11/01/2016 28.4  26.0 - 34.0 pg Final  . MCHC 11/01/2016 32.7  30.0 - 36.0 g/dL Final  . RDW 11/01/2016 14.7  11.5 - 15.5 % Final  . Platelets 11/01/2016 135* 150 - 400 K/uL Final  . Magnesium 11/01/2016 1.9  1.7 - 2.4 mg/dL Final  . Phosphorus 11/01/2016 3.2  2.5 - 4.6 mg/dL Final  . Heparin Unfractionated 11/01/2016 0.63  0.30 - 0.70 IU/mL Final   Comment:        IF HEPARIN RESULTS ARE BELOW EXPECTED VALUES, AND PATIENT DOSAGE HAS BEEN CONFIRMED, SUGGEST FOLLOW UP TESTING OF ANTITHROMBIN III LEVELS.   . WBC 11/02/2016 6.3  4.0 - 10.5 K/uL Final  . RBC 11/02/2016 4.79  4.22 - 5.81 MIL/uL Final  . Hemoglobin  11/02/2016 13.5  13.0 - 17.0 g/dL Final  . HCT 11/02/2016 41.5  39.0 - 52.0 % Final  . MCV 11/02/2016 86.6  78.0 - 100.0 fL Final  . MCH 11/02/2016 28.2  26.0 - 34.0 pg Final  . MCHC 11/02/2016 32.5  30.0 - 36.0 g/dL Final  . RDW 11/02/2016 14.5  11.5 - 15.5 % Final  . Platelets 11/02/2016 145* 150 - 400 K/uL Final  . Magnesium 11/02/2016 2.0  1.7 - 2.4 mg/dL Final  . Phosphorus 11/02/2016 3.8  2.5 - 4.6 mg/dL Final  . Heparin Unfractionated 11/02/2016 0.59  0.30 - 0.70 IU/mL Final   Comment:        IF HEPARIN RESULTS ARE BELOW EXPECTED VALUES, AND PATIENT DOSAGE HAS BEEN CONFIRMED, SUGGEST FOLLOW UP TESTING OF ANTITHROMBIN III LEVELS.   Marland Kitchen Sodium 11/02/2016 138  135 - 145 mmol/L Final  . Potassium 11/02/2016 4.3  3.5 - 5.1 mmol/L Final  . Chloride 11/02/2016 106  101 - 111 mmol/L Final  . CO2 11/02/2016 27  22 - 32 mmol/L Final  . Glucose, Bld 11/02/2016 115* 65 - 99 mg/dL Final  . BUN 11/02/2016 17  6 - 20 mg/dL Final  . Creatinine, Ser 11/02/2016 1.04  0.61 - 1.24 mg/dL Final  . Calcium 11/02/2016 9.1  8.9 - 10.3 mg/dL Final  . GFR calc non Af Amer 11/02/2016 >60  >60 mL/min Final  . GFR calc Af Amer 11/02/2016 >60  >60 mL/min Final   Comment: (NOTE) The eGFR has been calculated using the CKD EPI equation. This calculation has not been validated in all clinical situations. eGFR's persistently <60  mL/min signify possible Chronic Kidney Disease.   . Anion gap 11/02/2016 5  5 - 15 Final    Dg Chest 2 View  Result Date: 10/30/2016 CLINICAL DATA:  Acute onset of shortness of breath approximately 1 hour ago. EXAM: CHEST  2 VIEW COMPARISON:  10/15/2016 FINDINGS: Heart size is within normal limits.  Aortic atherosclerosis. Chronic elevation of left hemidiaphragm is stable. Both lungs are clear. No evidence of pneumothorax or pleural effusion. IMPRESSION: Stable chronic elevation of left hemidiaphragm. No active lung disease. Aortic atherosclerosis. Electronically Signed   By: Earle Gell M.D.   On: 10/30/2016 15:52   Ct Angio Chest Pe W And/or Wo Contrast  Result Date: 10/30/2016 CLINICAL DATA:  Sub onset shortness of breath. EXAM: CT ANGIOGRAPHY CHEST WITH CONTRAST TECHNIQUE: Multidetector CT imaging of the chest was performed using the standard protocol during bolus administration of intravenous contrast. Multiplanar CT image reconstructions and MIPs were obtained to evaluate the vascular anatomy. CONTRAST:  100 cc Isovue 370. COMPARISON:  None. FINDINGS: Cardiovascular: There are filling defects in the pulmonary arteries bilaterally with the most proximal clot is seen in the distal right and left main pulmonary arteries. RV/LV ratio is 1.9. Atherosclerotic calcification of the arterial vasculature, including coronary arteries. Heart size normal. No pericardial effusion. Mediastinum/Nodes: No pathologically enlarged mediastinal, hilar or axillary lymph nodes. Esophagus is grossly unremarkable. Lungs/Pleura: Image quality is degraded by respiratory motion. A few scattered pulmonary nodules measure 4 mm or less in size. Mild volume loss of the base of the left hemi thorax, adjacent to an elevated left hemidiaphragm. No pleural fluid. Airway is unremarkable. Upper Abdomen: Sub cm low-attenuation lesion in the left hepatic lobe is too small to characterize. Visualized portions of the liver, gallbladder, adrenal glands, left kidney, spleen, pancreas, stomach and bowel are otherwise grossly unremarkable. Musculoskeletal: No worrisome lytic or sclerotic lesions. Degenerative changes are seen in the spine. Review of the MIP images confirms the above findings. IMPRESSION: 1. Positive for acute PE with CT evidence of right heart strain (RV/LV Ratio = 1.9) consistent with at least submassive (intermediate risk) PE. The presence of right heart strain has been associated with an increased risk of morbidity and mortality. Please activate Code PE by paging [phone]. Critical Value/emergent results  were called by telephone at the time of interpretation on 10/30/2016 at 6:05 pm to Dr. Brantley Stage , who verbally acknowledged these results. 2. Aortic atherosclerosis (ICD10-170.0). Coronary artery calcification. 3. Scattered pulmonary nodules measure 4 mm or less in size. No follow-up needed if patient is low-risk (and has no known or suspected primary neoplasm). Non-contrast chest C in T can be considered in 12 months if patient is high-risk. This recommendation follows the consensus statement: Guidelines for Management of Incidental Pulmonary Nodules Detected on CT Images: From the Fleischner Society 2017; Radiology 2017; 284:228-243. Electronically Signed   By: Lorin Picket M.D.   On: 10/30/2016 18:10     Assessment/Plan   ICD-9-CM ICD-10-CM   1. Physical deconditioning 799.3 R53.81   2. Alzheimer's dementia without behavioral disturbance, unspecified timing of dementia onset 331.0 G30.9    294.10 F02.80   3. Other pulmonary embolism without acute cor pulmonale, unspecified chronicity (HCC) 415.19 I26.99   4. Essential hypertension 401.9 I10   5. Pulmonary nodules 793.19 R91.8   6. Depression, unspecified depression type 311 F32.9   7. Rheumatoid arthritis involving both hands, unspecified rheumatoid factor presence (HCC) 714.0 M06.9   8. Skin lesion of right ear 709.9 L98.9  probable skin cancer - SCC vs BCC    Refer to dermatology for skin lesion  Will need repeat CT chest in 6 mos due to MTX use  Cont current meds as ordered  PT/OT/St as ordered  F/u with rheumatology as scheduled  Follow CBC and CMP due to MTX use  GOAL: short term rehab then transition to long term care. Communicated with pt and nursing.  Will follow  Jerri Glauser S. Perlie Gold  [facility] [address] [place], [place] [address] [phone] Cell (Monday-Friday 8 AM - 5 PM) [phone] After 5 PM and follow prompts

## 2016-12-08 ENCOUNTER — Non-Acute Institutional Stay (SKILLED_NURSING_FACILITY): Payer: Medicare Other | Admitting: Adult Health

## 2016-12-08 ENCOUNTER — Encounter: Payer: Self-pay | Admitting: Adult Health

## 2016-12-08 DIAGNOSIS — F329 Major depressive disorder, single episode, unspecified: Secondary | ICD-10-CM | POA: Diagnosis not present

## 2016-12-08 DIAGNOSIS — M069 Rheumatoid arthritis, unspecified: Secondary | ICD-10-CM | POA: Diagnosis not present

## 2016-12-08 DIAGNOSIS — I2699 Other pulmonary embolism without acute cor pulmonale: Secondary | ICD-10-CM | POA: Diagnosis not present

## 2016-12-08 DIAGNOSIS — G301 Alzheimer's disease with late onset: Secondary | ICD-10-CM | POA: Diagnosis not present

## 2016-12-08 DIAGNOSIS — F0280 Dementia in other diseases classified elsewhere without behavioral disturbance: Secondary | ICD-10-CM

## 2016-12-08 DIAGNOSIS — F32A Depression, unspecified: Secondary | ICD-10-CM

## 2016-12-08 DIAGNOSIS — I10 Essential (primary) hypertension: Secondary | ICD-10-CM | POA: Diagnosis not present

## 2016-12-08 NOTE — Progress Notes (Signed)
Location:   [facility]   Place of Service:  SNF (31)   CODE STATUS: full code  Allergies  Allergen Reactions  . Penicillins Other (See Comments)    Family thinks pt is allergic to penicillin but not sure     Chief Complaint  Patient presents with  . Medical Management of Chronic Issues    HPI:  He is a resident of this facility  being seen for the management of his chronic illnesses. Overall his status is stable. He is unable to fully participate in the hpi or ros. There are no nursing concerns at this time.    Past Medical History:  Diagnosis Date  . Alzheimer's dementia   . Hypertension   . RA (rheumatoid arthritis) (HCC)     Past Surgical History:  Procedure Laterality Date  . JOINT REPLACEMENT     left hip replacement 09-19-16    Social History   Social History  . Marital status: Single    Spouse name: N/A  . Number of children: N/A  . Years of education: N/A   Occupational History  . Not on file.   Social History Main Topics  . Smoking status: Never Smoker  . Smokeless tobacco: Never Used  . Alcohol use No  . Drug use: No  . Sexual activity: No   Other Topics Concern  . Not on file   Social History Narrative  . No narrative on file   Family History  Problem Relation Age of Onset  . Other Father     On coumadin      VITAL SIGNS BP 120/70   Pulse 90   Temp 98.6 F (37 C)   Resp 20   Ht 5\' 7"  (1.702 m)   Wt 162 lb (73.5 kg)   SpO2 98%   BMI 25.37 kg/m   Patient's Medications  New Prescriptions   No medications on file  Previous Medications   APIXABAN (ELIQUIS) 5 MG TABS TABLET    Take 1 tablet (5 mg total) by mouth 2 (two) times daily. Start after 10 mg tabs are finished   CALCIUM-VITAMIN D (OSCAL WITH D) 500-200 MG-UNIT TABLET    Take 1 tablet by mouth 2 (two) times daily.   CITALOPRAM (CELEXA) 10 MG TABLET    Take 10 mg by mouth daily.   CYANOCOBALAMIN 1000 MCG TABLET    Take 1,000 mcg by mouth daily.   DONEPEZIL (ARICEPT)  10 MG TABLET    Take 10 mg by mouth daily.   FOLIC ACID (FOLVITE) 1 MG TABLET    Take 1 mg by mouth daily.   LOSARTAN (COZAAR) 100 MG TABLET    Take 100 mg by mouth daily.   METHOTREXATE (RHEUMATREX) 2.5 MG TABLET    Take 12.5 mg by mouth once a week. Caution:Chemotherapy. Protect from light.  Pt takes on Tuesday evenings... Was changed by MD- per pts daughter  Modified Medications   No medications on file  Discontinued Medications   APIXABAN (ELIQUIS) 5 MG TABS TABLET    Take 2 tablets (10 mg total) by mouth 2 (two) times daily.     SIGNIFICANT DIAGNOSTIC EXAMS   10-30-16: chest x-ray: Stable chronic elevation of left hemidiaphragm. No active lung disease. Aortic atherosclerosis.  10-30-16: ct angio of chest: 1. Positive for acute PE with CT evidence of right heart strain (RV/LV Ratio = 1.9) consistent with at least submassive (intermediate risk) PE. The presence of right heart strain has been associated with an increased  risk of morbidity and mortality.  2. Aortic atherosclerosis (ICD10-170.0). Coronary artery calcification. 3. Scattered pulmonary nodules measure 4 mm or less in size. No follow-up needed if patient is low-risk (and has no known or suspected primary neoplasm). Non-contrast chest C in T can be considered in 12 months if patient is high-risk.    10-31-16: 2-d echo:   - Left ventricle: The cavity size was normal. Wall thickness was increased in a pattern of mild LVH. Systolic function was normal. The estimated ejection fraction was in the range of 55% to 60%. Wall motion was normal; there were no regional wall motion abnormalities. Doppler parameters are consistent with abnormal left ventricular relaxation (grade 1 diastolic dysfunction). - Right ventricle: Hypokinesis of the RV apex. - Pulmonary arteries: Systolic pressure was mildly increased. PA   peak pressure: 43 mm Hg (S). Impressions: - Normal LV systolic function; grade 1 diastolic dysfunction; mild  TR; akinesis of  the apical RV consistent with Mcconnell&'s sign (suggestive of pulmonary embolus).    LABS REVIEWED:   10-30-16: wbc 12.8; hgb 14.6; hct 44.7; mcv 87.1; plt 166; glucose 167; bun 17; creat 0.92; k+ 4.1 na++ 136; liver normal albumin 3.6 11-02-16: wbc 6.3; hgb 13.5; hct 41.5 ;mcv 86.6; plt 145; glucose 115; bun 17; creat 1.04; k+ 4.3; na++ 138; mag 2.0; phos 3.8 11-08-16: wbc 4.6; hgb 14.0; hct 42.8; mcv 88.9 ;plt 176; glucose 155; bun 14.5; creat 0.91; k+ 4.4; na++ 135; liver normal albumin 3.7     Review of Systems  Unable to perform ROS: Dementia     Physical Exam  Constitutional: He appears well-developed and well-nourished. No distress.  Eyes: Conjunctivae are normal.  Neck: Neck supple. No JVD present. No thyromegaly present.  Cardiovascular: Normal rate, regular rhythm and intact distal pulses.   Respiratory: Effort normal and breath sounds normal. No respiratory distress. He has no wheezes.  GI: Soft. Bowel sounds are normal. He exhibits no distension. There is no tenderness.  Musculoskeletal: He exhibits no edema.  Able to move all extremities   Lymphadenopathy:    He has no cervical adenopathy.  Neurological: He is alert.  Skin: Skin is warm and dry. He is not diaphoretic.  Psychiatric: He has a normal mood and affect.    ASSESSMENT/ PLAN:  1. Hypertension: will continue cozaar 100 mg daily   2. Alzheimer's disease: is presently without change; will continue aricept 10 mg nightly and will monitor  3. RA; will continue methotrexate 12.5 mg weekly takes folic acid 1 mg daily   4. Depression: will continue celexa 10 mg daily   5.  Pulmonary embolism: EF 55-60% (10-31-16); is on eliquis 5 mg twice daily will need this therapy for 12 months.   6. Pulmonary nodules: scattered less than 4 mm will monitor      MD is aware of resident's narcotic use and is in agreement with current plan of care. We will attempt to wean resident as apropriate   Synthia Innocent  NP [facility]  Contact [phone] Monday through Friday 8am- 5pm  After hours call [phone]

## 2017-01-10 ENCOUNTER — Encounter: Payer: Self-pay | Admitting: Internal Medicine

## 2017-01-10 ENCOUNTER — Non-Acute Institutional Stay (SKILLED_NURSING_FACILITY): Payer: Medicare Other | Admitting: Internal Medicine

## 2017-01-10 DIAGNOSIS — G301 Alzheimer's disease with late onset: Secondary | ICD-10-CM

## 2017-01-10 DIAGNOSIS — I2699 Other pulmonary embolism without acute cor pulmonale: Secondary | ICD-10-CM

## 2017-01-10 DIAGNOSIS — Z79899 Other long term (current) drug therapy: Secondary | ICD-10-CM

## 2017-01-10 DIAGNOSIS — F32A Depression, unspecified: Secondary | ICD-10-CM

## 2017-01-10 DIAGNOSIS — F329 Major depressive disorder, single episode, unspecified: Secondary | ICD-10-CM

## 2017-01-10 DIAGNOSIS — F0280 Dementia in other diseases classified elsewhere without behavioral disturbance: Secondary | ICD-10-CM | POA: Insufficient documentation

## 2017-01-10 DIAGNOSIS — I10 Essential (primary) hypertension: Secondary | ICD-10-CM | POA: Diagnosis not present

## 2017-01-10 DIAGNOSIS — R059 Cough, unspecified: Secondary | ICD-10-CM

## 2017-01-10 DIAGNOSIS — M069 Rheumatoid arthritis, unspecified: Secondary | ICD-10-CM | POA: Diagnosis not present

## 2017-01-10 DIAGNOSIS — R05 Cough: Secondary | ICD-10-CM

## 2017-01-10 DIAGNOSIS — R918 Other nonspecific abnormal finding of lung field: Secondary | ICD-10-CM | POA: Diagnosis not present

## 2017-01-10 NOTE — Progress Notes (Signed)
Patient ID: Joseph [name], male   DOB: 28-Nov-1929, 83 y.o.   MRN: [id number]    DATE: 01/10/2017  Location:    [facility] Number: 209 A Place of Service: SNF (31)   Extended Emergency Contact Information Primary Emergency Contact: Caviness,Carol Address: [address]          [place], [place] [address] [place] Phone: [phone] Relation: Other  Advanced Directive information Does Patient Have a Medical Advance Directive?: Yes, Type of Advance Directive: Out of facility DNR (pink MOST or yellow form), Pre-existing out of facility DNR order (yellow form or pink MOST form): Yellow form placed in chart (order not valid for inpatient use)  Chief Complaint  Patient presents with  . Medical Management of Chronic Issues    Routine Visit    HPI:  83 yo male long term resident seen today for f/u. Per nursing, he has a productive cough with yellow sputum. No f/c, CP, SOB. His roommate also has a cough. He is a poor historian due to dementia. Hx obtained from chart  Hypertension -stable on cozaar 100 mg daily   Alzheimer's disease - stable on aricept 10 mg nightly  RA - stable on methotrexate 12.5 mg weekly; takes folic acid 1 mg daily   Depression - stable on celexa 10 mg daily   Hx Pulmonary embolism with EF 55-60% (10-31-16) - takes eliquis 5 mg twice daily and will need this therapy for 12 months.   Pulmonary nodules -  scattered less than 4 mm     Past Medical History:  Diagnosis Date  . Alzheimer's dementia   . Hypertension   . RA (rheumatoid arthritis) ([facility])     Past Surgical History:  Procedure Laterality Date  . JOINT REPLACEMENT     left hip replacement 09-19-16    Patient Care Team: Chriss Czar, MD as PCP - General (Family Medicine)  Social History   Social History  . Marital status: Single    Spouse name: N/A  . Number of children: N/A  . Years of education: N/A   Occupational History  . Not on file.   Social History  Main Topics  . Smoking status: Never Smoker  . Smokeless tobacco: Never Used  . Alcohol use No  . Drug use: No  . Sexual activity: No   Other Topics Concern  . Not on file   Social History Narrative  . No narrative on file     reports that he has never smoked. He has never used smokeless tobacco. He reports that he does not drink alcohol or use drugs.  Family History  Problem Relation Age of Onset  . Other Father     On coumadin   Family Status  Relation Status  . Father      There is no immunization history on file for this patient.  Allergies  Allergen Reactions  . Penicillins Other (See Comments)    Family thinks pt is allergic to penicillin but not sure     Medications: Patient's Medications  New Prescriptions   No medications on file  Previous Medications   APIXABAN (ELIQUIS) 5 MG TABS TABLET    Take 1 tablet (5 mg total) by mouth 2 (two) times daily. Start after 10 mg tabs are finished   CALCIUM-VITAMIN D (OSCAL WITH D) 500-200 MG-UNIT TABLET    Take 1 tablet by mouth 2 (two) times daily.   CITALOPRAM (CELEXA) 10 MG TABLET    Take  10 mg by mouth daily.   CYANOCOBALAMIN 1000 MCG TABLET    Take 1,000 mcg by mouth daily.   DONEPEZIL (ARICEPT) 10 MG TABLET    Take 10 mg by mouth daily.   FOLIC ACID (FOLVITE) 1 MG TABLET    Take 1 mg by mouth daily.   LOSARTAN (COZAAR) 100 MG TABLET    Take 100 mg by mouth daily.   METHOTREXATE (RHEUMATREX) 2.5 MG TABLET    Take 2.5 mg by mouth once a week. Caution:Chemotherapy. Protect from light.  Pt takes on Tuesday evenings... Was changed by MD- per pts daughter  Modified Medications   No medications on file  Discontinued Medications   No medications on file    Review of Systems  Unable to perform ROS: Dementia    Vitals:   01/10/17 1448  BP: (!) 145/68  Pulse: 87  Resp: 18  Temp: 98 F (36.7 C)  TempSrc: Oral  SpO2: 99%  Weight: 161 lb 8 oz (73.3 kg)  Height: 5' 7"  (1.702 m)   Body mass index is 25.29  kg/m.  Physical Exam  Constitutional: He appears well-developed and well-nourished.  Looks well in NAD, sitting in w/c at bedside  HENT:  Mouth/Throat: Oropharynx is clear and moist.  MMM. No oral thrush  Eyes: Pupils are equal, round, and reactive to light. No scleral icterus.  Neck: Neck supple. Carotid bruit is not present. No thyromegaly present.  Cardiovascular: Normal rate, regular rhythm and intact distal pulses.  Exam reveals no gallop and no friction rub.   Murmur (1/6 SEM) heard. no distal LE swelling. No calf TTP  Pulmonary/Chest: Effort normal and breath sounds normal. He has no wheezes. He has no rales. He exhibits no tenderness.  Abdominal: Soft. Bowel sounds are normal. He exhibits no distension, no abdominal bruit, no pulsatile midline mass and no mass. There is no hepatomegaly. There is no tenderness. There is no rebound and no guarding.  Musculoskeletal: He exhibits edema.  Lymphadenopathy:    He has no cervical adenopathy.  Neurological: He is alert.  Skin: Skin is warm and dry. No lesion and no rash noted.  Psychiatric: He has a normal mood and affect. His behavior is normal. Thought content normal.     Labs reviewed: Nursing Home on 11/08/2016  Component Date Value Ref Range Status  . Hemoglobin 11/08/2016 14.0  13.5 - 17.5 g/dL Final  . HCT 11/08/2016 43  41 - 53 % Final  . Platelets 11/08/2016 176  150 - 399 K/L Final  . WBC 11/08/2016 4.6  10^3/mL Final  . Glucose 11/08/2016 155  mg/dL Final  . BUN 11/08/2016 15  4 - 21 mg/dL Final  . Creatinine 11/08/2016 0.9  0.6 - 1.3 mg/dL Final  . Potassium 11/08/2016 4.4  3.4 - 5.3 mmol/L Final  . Sodium 11/08/2016 135* 137 - 147 mmol/L Final  . Alkaline Phosphatase 11/08/2016 119  25 - 125 U/L Final  . ALT 11/08/2016 57* 10 - 40 U/L Final  . AST 11/08/2016 17  14 - 40 U/L Final  . Bilirubin, Total 11/08/2016 0.3  mg/dL Final  Admission on 10/30/2016, Discharged on 11/02/2016  Component Date Value Ref Range  Status  . WBC 10/30/2016 12.8* 4.0 - 10.5 K/uL Final  . RBC 10/30/2016 5.13  4.22 - 5.81 MIL/uL Final  . Hemoglobin 10/30/2016 14.6  13.0 - 17.0 g/dL Final  . HCT 10/30/2016 44.7  39.0 - 52.0 % Final  . MCV 10/30/2016 87.1  78.0 -  100.0 fL Final  . MCH 10/30/2016 28.5  26.0 - 34.0 pg Final  . MCHC 10/30/2016 32.7  30.0 - 36.0 g/dL Final  . RDW 10/30/2016 14.7  11.5 - 15.5 % Final  . Platelets 10/30/2016 166  150 - 400 K/uL Final  . Neutrophils Relative % 10/30/2016 87  % Final  . Neutro Abs 10/30/2016 11.1* 1.7 - 7.7 K/uL Final  . Lymphocytes Relative 10/30/2016 6  % Final  . Lymphs Abs 10/30/2016 0.8  0.7 - 4.0 K/uL Final  . Monocytes Relative 10/30/2016 6  % Final  . Monocytes Absolute 10/30/2016 0.8  0.1 - 1.0 K/uL Final  . Eosinophils Relative 10/30/2016 1  % Final  . Eosinophils Absolute 10/30/2016 0.1  0.0 - 0.7 K/uL Final  . Basophils Relative 10/30/2016 0  % Final  . Basophils Absolute 10/30/2016 0.0  0.0 - 0.1 K/uL Final  . Sodium 10/30/2016 136  135 - 145 mmol/L Final  . Potassium 10/30/2016 4.1  3.5 - 5.1 mmol/L Final  . Chloride 10/30/2016 102  101 - 111 mmol/L Final  . CO2 10/30/2016 24  22 - 32 mmol/L Final  . Glucose, Bld 10/30/2016 167* 65 - 99 mg/dL Final  . BUN 10/30/2016 17  6 - 20 mg/dL Final  . Creatinine, Ser 10/30/2016 0.92  0.61 - 1.24 mg/dL Final  . Calcium 10/30/2016 9.0  8.9 - 10.3 mg/dL Final  . Total Protein 10/30/2016 7.1  6.5 - 8.1 g/dL Final  . Albumin 10/30/2016 3.6  3.5 - 5.0 g/dL Final  . AST 10/30/2016 30  15 - 41 U/L Final  . ALT 10/30/2016 47  17 - 63 U/L Final  . Alkaline Phosphatase 10/30/2016 115  38 - 126 U/L Final  . Total Bilirubin 10/30/2016 0.7  0.3 - 1.2 mg/dL Final  . GFR calc non Af Amer 10/30/2016 >60  >60 mL/min Final  . GFR calc Af Amer 10/30/2016 >60  >60 mL/min Final   Comment: (NOTE) The eGFR has been calculated using the CKD EPI equation. This calculation has not been validated in all clinical situations. eGFR's  persistently <60 mL/min signify possible Chronic Kidney Disease.   . Anion gap 10/30/2016 10  5 - 15 Final  . Color, Urine 10/30/2016 YELLOW  YELLOW Final  . APPearance 10/30/2016 CLEAR  CLEAR Final  . Specific Gravity, Urine 10/30/2016 1.011  1.005 - 1.030 Final  . pH 10/30/2016 5.5  5.0 - 8.0 Final  . Glucose, UA 10/30/2016 NEGATIVE  NEGATIVE mg/dL Final  . Hgb urine dipstick 10/30/2016 NEGATIVE  NEGATIVE Final  . Bilirubin Urine 10/30/2016 NEGATIVE  NEGATIVE Final  . Ketones, ur 10/30/2016 NEGATIVE  NEGATIVE mg/dL Final  . Protein, ur 10/30/2016 NEGATIVE  NEGATIVE mg/dL Final  . Nitrite 10/30/2016 NEGATIVE  NEGATIVE Final  . Leukocytes, UA 10/30/2016 NEGATIVE  NEGATIVE Final  . Troponin i, poc 10/30/2016 0.73* 0.00 - 0.08 ng/mL Final  . Comment 10/30/2016 NOTIFIED PHYSICIAN   Final  . Comment 3 10/30/2016          Final   Comment: Due to the release kinetics of cTnI, a negative result within the first hours of the onset of symptoms does not rule out myocardial infarction with certainty. If myocardial infarction is still suspected, repeat the test at appropriate intervals.   . Lactic Acid, Venous 10/30/2016 2.31* 0.5 - 1.9 mmol/L Final  . Comment 10/30/2016 NOTIFIED PHYSICIAN   Final  . Lactic Acid, Venous 10/30/2016 1.25  0.5 - 1.9 mmol/L Final  .  B Natriuretic Peptide 10/30/2016 20.2  0.0 - 100.0 pg/mL Final  . Heparin Unfractionated 10/31/2016 0.68  0.30 - 0.70 IU/mL Final   Comment:        IF HEPARIN RESULTS ARE BELOW EXPECTED VALUES, AND PATIENT DOSAGE HAS BEEN CONFIRMED, SUGGEST FOLLOW UP TESTING OF ANTITHROMBIN III LEVELS.   . WBC 10/31/2016 8.5  4.0 - 10.5 K/uL Final  . RBC 10/31/2016 4.71  4.22 - 5.81 MIL/uL Final  . Hemoglobin 10/31/2016 13.5  13.0 - 17.0 g/dL Final  . HCT 10/31/2016 41.1  39.0 - 52.0 % Final  . MCV 10/31/2016 87.3  78.0 - 100.0 fL Final  . MCH 10/31/2016 28.7  26.0 - 34.0 pg Final  . MCHC 10/31/2016 32.8  30.0 - 36.0 g/dL Final  . RDW  10/31/2016 14.7  11.5 - 15.5 % Final  . Platelets 10/31/2016 149* 150 - 400 K/uL Final  . aPTT 10/30/2016 91* 24 - 36 seconds Final   Comment:        IF BASELINE aPTT IS ELEVATED, SUGGEST PATIENT RISK ASSESSMENT BE USED TO DETERMINE APPROPRIATE ANTICOAGULANT THERAPY.   . Sodium 10/31/2016 136  135 - 145 mmol/L Final  . Potassium 10/31/2016 4.3  3.5 - 5.1 mmol/L Final  . Chloride 10/31/2016 105  101 - 111 mmol/L Final  . CO2 10/31/2016 24  22 - 32 mmol/L Final  . Glucose, Bld 10/31/2016 133* 65 - 99 mg/dL Final  . BUN 10/31/2016 16  6 - 20 mg/dL Final  . Creatinine, Ser 10/31/2016 0.91  0.61 - 1.24 mg/dL Final  . Calcium 10/31/2016 8.7* 8.9 - 10.3 mg/dL Final  . GFR calc non Af Amer 10/31/2016 >60  >60 mL/min Final  . GFR calc Af Amer 10/31/2016 >60  >60 mL/min Final   Comment: (NOTE) The eGFR has been calculated using the CKD EPI equation. This calculation has not been validated in all clinical situations. eGFR's persistently <60 mL/min signify possible Chronic Kidney Disease.   . Anion gap 10/31/2016 7  5 - 15 Final  . Troponin I 10/30/2016 1.26* <0.03 ng/mL Final   Comment: RESULT REPEATED AND VERIFIED CRITICAL RESULT CALLED TO, READ BACK BY AND VERIFIED WITH: A KROLICZAK RN @ 4650 ON 35/46/56 BY C DAVIS   . Troponin I 10/31/2016 0.65* <0.03 ng/mL Final  . Troponin I 10/31/2016 0.41* <0.03 ng/mL Final  . Weight 10/31/2016 2610.25  oz Final  . Height 10/31/2016 67  in Final  . BP 10/31/2016 136/83  mmHg Final  . LV PW d 10/31/2016 11.9* 0.6 - 1.1 mm Final  . FS 10/31/2016 32  28 - 44 % Final  . LA vol 10/31/2016 40.3  mL Final  . LA ID, A-P, ES 10/31/2016 31  mm Final  . IVS/LV PW RATIO, ED 10/31/2016 .97   Final  . Reg peak vel 10/31/2016 315  cm/s Final  . RV sys press 10/31/2016 43  mmHg Final  . LV e' LATERAL 10/31/2016 6.53  cm/s Final  . LV E/e' medial 10/31/2016 9.66   Final  . LV E/e'average 10/31/2016 9.66   Final  . LA diam index 10/31/2016 1.65  cm/m2 Final    . LA vol A4C 10/31/2016 35.2  ml Final  . LVOT diameter 10/31/2016 21  mm Final  . LVOT area 10/31/2016 3.46  cm2 Final  . E/e' ratio 10/31/2016 9.66   Final  . MV pk E vel 10/31/2016 63.1  m/s Final  . TR max vel 10/31/2016 315  cm/s  Final  . MV pk A vel 10/31/2016 120  m/s Final  . LA vol index 10/31/2016 21.4  mL/m2 Final  . LA diam end sys 10/31/2016 31.00  mm Final  . TDI e' medial 10/31/2016 5.55   Final  . TDI e' lateral 10/31/2016 6.53   Final  . Lateral S' vel 10/31/2016 14.60  cm/sec Final  . TAPSE 10/31/2016 15.60  mm Final  . MRSA by PCR 10/30/2016 NEGATIVE  NEGATIVE Final   Comment:        The GeneXpert MRSA Assay (FDA approved for NASAL specimens only), is one component of a comprehensive MRSA colonization surveillance program. It is not intended to diagnose MRSA infection nor to guide or monitor treatment for MRSA infections.   . Heparin Unfractionated 10/31/2016 0.51  0.30 - 0.70 IU/mL Final   Comment:        IF HEPARIN RESULTS ARE BELOW EXPECTED VALUES, AND PATIENT DOSAGE HAS BEEN CONFIRMED, SUGGEST FOLLOW UP TESTING OF ANTITHROMBIN III LEVELS.   . WBC 11/01/2016 6.5  4.0 - 10.5 K/uL Final  . RBC 11/01/2016 4.54  4.22 - 5.81 MIL/uL Final  . Hemoglobin 11/01/2016 12.9* 13.0 - 17.0 g/dL Final  . HCT 11/01/2016 39.5  39.0 - 52.0 % Final  . MCV 11/01/2016 87.0  78.0 - 100.0 fL Final  . MCH 11/01/2016 28.4  26.0 - 34.0 pg Final  . MCHC 11/01/2016 32.7  30.0 - 36.0 g/dL Final  . RDW 11/01/2016 14.7  11.5 - 15.5 % Final  . Platelets 11/01/2016 135* 150 - 400 K/uL Final  . Magnesium 11/01/2016 1.9  1.7 - 2.4 mg/dL Final  . Phosphorus 11/01/2016 3.2  2.5 - 4.6 mg/dL Final  . Heparin Unfractionated 11/01/2016 0.63  0.30 - 0.70 IU/mL Final   Comment:        IF HEPARIN RESULTS ARE BELOW EXPECTED VALUES, AND PATIENT DOSAGE HAS BEEN CONFIRMED, SUGGEST FOLLOW UP TESTING OF ANTITHROMBIN III LEVELS.   . WBC 11/02/2016 6.3  4.0 - 10.5 K/uL Final  . RBC 11/02/2016  4.79  4.22 - 5.81 MIL/uL Final  . Hemoglobin 11/02/2016 13.5  13.0 - 17.0 g/dL Final  . HCT 11/02/2016 41.5  39.0 - 52.0 % Final  . MCV 11/02/2016 86.6  78.0 - 100.0 fL Final  . MCH 11/02/2016 28.2  26.0 - 34.0 pg Final  . MCHC 11/02/2016 32.5  30.0 - 36.0 g/dL Final  . RDW 11/02/2016 14.5  11.5 - 15.5 % Final  . Platelets 11/02/2016 145* 150 - 400 K/uL Final  . Magnesium 11/02/2016 2.0  1.7 - 2.4 mg/dL Final  . Phosphorus 11/02/2016 3.8  2.5 - 4.6 mg/dL Final  . Heparin Unfractionated 11/02/2016 0.59  0.30 - 0.70 IU/mL Final   Comment:        IF HEPARIN RESULTS ARE BELOW EXPECTED VALUES, AND PATIENT DOSAGE HAS BEEN CONFIRMED, SUGGEST FOLLOW UP TESTING OF ANTITHROMBIN III LEVELS.   Marland Kitchen Sodium 11/02/2016 138  135 - 145 mmol/L Final  . Potassium 11/02/2016 4.3  3.5 - 5.1 mmol/L Final  . Chloride 11/02/2016 106  101 - 111 mmol/L Final  . CO2 11/02/2016 27  22 - 32 mmol/L Final  . Glucose, Bld 11/02/2016 115* 65 - 99 mg/dL Final  . BUN 11/02/2016 17  6 - 20 mg/dL Final  . Creatinine, Ser 11/02/2016 1.04  0.61 - 1.24 mg/dL Final  . Calcium 11/02/2016 9.1  8.9 - 10.3 mg/dL Final  . GFR calc non Af Amer 11/02/2016 >60  >  60 mL/min Final  . GFR calc Af Amer 11/02/2016 >60  >60 mL/min Final   Comment: (NOTE) The eGFR has been calculated using the CKD EPI equation. This calculation has not been validated in all clinical situations. eGFR's persistently <60 mL/min signify possible Chronic Kidney Disease.   . Anion gap 11/02/2016 5  5 - 15 Final    No results found.   Assessment/Plan   ICD-9-CM ICD-10-CM   1. Cough in adult - NEW 786.2 R05   2. High risk medication use V58.69 Z79.899    on MTX for RA  3. Rheumatoid arthritis involving both hands, unspecified rheumatoid factor presence (HCC) 714.0 M06.9   4. Essential hypertension 401.9 I10   5. Other pulmonary embolism without acute cor pulmonale, unspecified chronicity (HCC) 415.19 I26.99   6. Late onset Alzheimer's disease without  behavioral disturbance 331.0 G30.1    294.10 F02.80   7. Depression, unspecified depression type 311 F32.9   8. Pulmonary nodules 793.19 R91.8     Check CXR to r/o pneumonia as he does take MTX. tx if (+)  Start mucinex 1239m every 12 hrs x 2 wks  Cont other meds as ordered  Check CBC w diff  F/u with specialists as scheduled  Cont PT/Ot/ST as indicated  Will follow  Kashawna Manzer S. CPerlie Gold PMethodist [facility] [address]GHaines [place] [address]([phone] (Monday-Friday 8 AM - 5 PM) [phone] 5 PM and follow prompts

## 2017-02-01 ENCOUNTER — Encounter
Admission: RE | Admit: 2017-02-01 | Discharge: 2017-02-01 | Disposition: A | Discharge status: Home / Self Care | Payer: Medicare Other | Source: Ambulatory Visit | Attending: Internal Medicine | Admitting: Internal Medicine

## 2017-02-01 ENCOUNTER — Non-Acute Institutional Stay (SKILLED_NURSING_FACILITY): Payer: Medicare Other | Admitting: Adult Health

## 2017-02-01 ENCOUNTER — Encounter: Payer: Self-pay | Admitting: Adult Health

## 2017-02-01 DIAGNOSIS — M069 Rheumatoid arthritis, unspecified: Secondary | ICD-10-CM | POA: Diagnosis not present

## 2017-02-01 DIAGNOSIS — I2699 Other pulmonary embolism without acute cor pulmonale: Secondary | ICD-10-CM

## 2017-02-01 DIAGNOSIS — G301 Alzheimer's disease with late onset: Secondary | ICD-10-CM

## 2017-02-01 DIAGNOSIS — F0280 Dementia in other diseases classified elsewhere without behavioral disturbance: Secondary | ICD-10-CM | POA: Diagnosis not present

## 2017-02-01 NOTE — Progress Notes (Signed)
Location:   [facility] Room Number: 109 A Place of Service:  SNF (31)     CODE STATUS: DNR  Allergies  Allergen Reactions  . Penicillins Other (See Comments)    Family thinks pt is allergic to penicillin but not sure     Chief Complaint  Patient presents with  . Discharge Note    HPI:  He is being discharged to SNF; [facility] in order to be closer to his family. He will not need dme; prescriptions written. He will follow with the medical provider at the receiving facility.  He had been hospitalized for new pulmonary embolism.    Past Medical History:  Diagnosis Date  . Alzheimer's dementia   . Hypertension   . RA (rheumatoid arthritis) (HCC)     Past Surgical History:  Procedure Laterality Date  . JOINT REPLACEMENT     left hip replacement 09-19-16    Social History   Social History  . Marital status: Single    Spouse name: N/A  . Number of children: N/A  . Years of education: N/A   Occupational History  . Not on file.   Social History Main Topics  . Smoking status: Never Smoker  . Smokeless tobacco: Never Used  . Alcohol use No  . Drug use: No  . Sexual activity: No   Other Topics Concern  . Not on file   Social History Narrative  . No narrative on file   Family History  Problem Relation Age of Onset  . Other Father     On coumadin    VITAL SIGNS BP 110/60   Pulse 88   Temp 97 F (36.1 C)   Resp 16   Ht 5\' 7"  (1.702 m)   Wt 161 lb 8 oz (73.3 kg)   SpO2 99%   BMI 25.29 kg/m   Patient's Medications  New Prescriptions   No medications on file  Previous Medications   APIXABAN (ELIQUIS) 5 MG TABS TABLET    Take 1 tablet (5 mg total) by mouth 2 (two) times daily. Start after 10 mg tabs are finished   CALCIUM-VITAMIN D (OSCAL WITH D) 500-200 MG-UNIT TABLET    Take 1 tablet by mouth 2 (two) times daily.   CITALOPRAM (CELEXA) 10 MG TABLET    Take 10 mg by mouth daily.   CYANOCOBALAMIN 500 MCG TABLET    Take 500 mcg by mouth  daily. Give 2 tablet by mouth one time a day for Supplement   DONEPEZIL (ARICEPT) 10 MG TABLET    Take 10 mg by mouth daily.   FOLIC ACID (FOLVITE) 1 MG TABLET    Take 1 mg by mouth daily.   LOSARTAN (COZAAR) 100 MG TABLET    Take 100 mg by mouth daily.   METHOTREXATE (RHEUMATREX) 2.5 MG TABLET    Take 2.5 mg by mouth once a week. Caution:Chemotherapy. Protect from light.  Pt takes on Tuesday evenings... Was changed by MD- per pts daughter  Modified Medications   No medications on file  Discontinued Medications   CYANOCOBALAMIN 1000 MCG TABLET    Take 1,000 mcg by mouth daily.     SIGNIFICANT DIAGNOSTIC EXAMS  10-30-16: chest x-ray: Stable chronic elevation of left hemidiaphragm. No active lung disease. Aortic atherosclerosis.  10-30-16: ct angio of chest: 1. Positive for acute PE with CT evidence of right heart strain (RV/LV Ratio = 1.9) consistent with at least submassive (intermediate risk) PE. The presence of right heart strain has been  associated with an increased risk of morbidity and mortality.  2. Aortic atherosclerosis (ICD10-170.0). Coronary artery calcification. 3. Scattered pulmonary nodules measure 4 mm or less in size. No follow-up needed if patient is low-risk (and has no known or suspected primary neoplasm). Non-contrast chest C in T can be considered in 12 months if patient is high-risk.    10-31-16: 2-d echo:   - Left ventricle: The cavity size was normal. Wall thickness was increased in a pattern of mild LVH. Systolic function was normal. The estimated ejection fraction was in the range of 55% to 60%. Wall motion was normal; there were no regional wall motion abnormalities. Doppler parameters are consistent with abnormal left ventricular relaxation (grade 1 diastolic dysfunction). - Right ventricle: Hypokinesis of the RV apex. - Pulmonary arteries: Systolic pressure was mildly increased. PA   peak pressure: 43 mm Hg (S). Impressions: - Normal LV systolic function; grade  1 diastolic dysfunction; mild  TR; akinesis of the apical RV consistent with Mcconnell&'s sign (suggestive of pulmonary embolus).    LABS REVIEWED:   10-30-16: wbc 12.8; hgb 14.6; hct 44.7; mcv 87.1; plt 166; glucose 167; bun 17; creat 0.92; k+ 4.1 na++ 136; liver normal albumin 3.6 11-02-16: wbc 6.3; hgb 13.5; hct 41.5 ;mcv 86.6; plt 145; glucose 115; bun 17; creat 1.04; k+ 4.3; na++ 138; mag 2.0; phos 3.8 11-08-16: wbc 4.6; hgb 14.0; hct 42.8; mcv 88.9 ;plt 176; glucose 155; bun 14.5; creat 0.91; k+ 4.4; na++ 135; liver normal albumin 3.7     Review of Systems  Unable to perform ROS: Dementia     Physical Exam  Constitutional: He appears well-developed and well-nourished. No distress.  Eyes: Conjunctivae are normal.  Neck: Neck supple. No JVD present. No thyromegaly present.  Cardiovascular: Normal rate, regular rhythm and intact distal pulses.   Respiratory: Effort normal and breath sounds normal. No respiratory distress. He has no wheezes.  GI: Soft. Bowel sounds are normal. He exhibits no distension. There is no tenderness.  Musculoskeletal: He exhibits no edema.  Able to move all extremities   Lymphadenopathy:    He has no cervical adenopathy.  Neurological: He is alert.  Skin: Skin is warm and dry. He is not diaphoretic.  Psychiatric: He has a normal mood and affect.    ASSESSMENT/ PLAN:  Patient is being discharged with the following home health services:  None required   Patient is being discharged with the following durable medical equipment:  None required  Patient has been advised to f/u with their PCP in 1-2 weeks to bring them up to date on their rehab stay.  Social services at facility was responsible for arranging this appointment.  Pt was provided with a 30 day supply of prescriptions for medications and refills must be obtained from their PCP.  For controlled substances, a more limited supply may be provided adequate until PCP appointment only.   Time spent  with patient  40   minutes >50% time spent counseling; reviewing medical record; tests; labs; and developing future plan of care    Synthia Innocent NP [facility]  Contact [phone] Monday through Friday 8am- 5pm  After hours call [phone]

## 2017-02-10 ENCOUNTER — Encounter
Admission: RE | Admit: 2017-02-10 | Discharge: 2017-02-10 | Disposition: A | Discharge status: Home / Self Care | Payer: Medicare Other | Source: Ambulatory Visit | Attending: Internal Medicine | Admitting: Internal Medicine

## 2017-02-10 DIAGNOSIS — I10 Essential (primary) hypertension: Secondary | ICD-10-CM | POA: Insufficient documentation

## 2017-02-10 DIAGNOSIS — M069 Rheumatoid arthritis, unspecified: Secondary | ICD-10-CM | POA: Insufficient documentation

## 2017-02-10 DIAGNOSIS — F329 Major depressive disorder, single episode, unspecified: Secondary | ICD-10-CM | POA: Insufficient documentation

## 2017-02-14 DIAGNOSIS — I10 Essential (primary) hypertension: Secondary | ICD-10-CM | POA: Diagnosis not present

## 2017-02-14 DIAGNOSIS — F329 Major depressive disorder, single episode, unspecified: Secondary | ICD-10-CM | POA: Diagnosis not present

## 2017-02-14 DIAGNOSIS — M069 Rheumatoid arthritis, unspecified: Secondary | ICD-10-CM | POA: Diagnosis not present

## 2017-02-14 LAB — COMPREHENSIVE METABOLIC PANEL
ALK PHOS: 67 U/L (ref 38–126)
ALT: 21 U/L (ref 17–63)
AST: 19 U/L (ref 15–41)
Albumin: 3.1 g/dL — ABNORMAL LOW (ref 3.5–5.0)
Anion gap: 6 (ref 5–15)
BUN: 12 mg/dL (ref 6–20)
CALCIUM: 8.6 mg/dL — AB (ref 8.9–10.3)
CHLORIDE: 107 mmol/L (ref 101–111)
CO2: 25 mmol/L (ref 22–32)
CREATININE: 0.75 mg/dL (ref 0.61–1.24)
GFR calc Af Amer: >60 mL/min (ref >60)
GFR calc non Af Amer: >60 mL/min (ref >60)
GLUCOSE: 96 mg/dL (ref 65–99)
Potassium: 4.1 mmol/L (ref 3.5–5.1)
SODIUM: 138 mmol/L (ref 135–145)
Total Bilirubin: 0.8 mg/dL (ref 0.3–1.2)
Total Protein: 6.1 g/dL — ABNORMAL LOW (ref 6.5–8.1)

## 2017-02-14 LAB — CBC WITH DIFFERENTIAL/PLATELET
BASOS ABS: 0 10*3/uL (ref 0–0.1)
BASOS PCT: 1 %
Eosinophils Absolute: 0.2 10*3/uL (ref 0–0.7)
Eosinophils Relative: 4 %
HEMATOCRIT: 41.7 % (ref 40.0–52.0)
HEMOGLOBIN: 14.1 g/dL (ref 13.0–18.0)
LYMPHS PCT: 18 %
Lymphs Abs: 1 10*3/uL (ref 1.0–3.6)
MCH: 27.6 pg (ref 26.0–34.0)
MCHC: 33.8 g/dL (ref 32.0–36.0)
MCV: 81.6 fL (ref 80.0–100.0)
MONO ABS: 0.6 10*3/uL (ref 0.2–1.0)
MONOS PCT: 11 %
NEUTROS ABS: 3.8 10*3/uL (ref 1.4–6.5)
NEUTROS PCT: 66 %
Platelets: 142 10*3/uL — ABNORMAL LOW (ref 150–440)
RBC: 5.11 MIL/uL (ref 4.40–5.90)
RDW: 16 % — ABNORMAL HIGH (ref 11.5–14.5)
WBC: 5.7 10*3/uL (ref 3.8–10.6)

## 2017-02-14 LAB — VITAMIN B12: Vitamin B-12: 535 pg/mL (ref 180–914)

## 2017-02-14 LAB — TSH: TSH: 2.98 u[IU]/mL (ref 0.350–4.500)

## 2017-02-14 LAB — MAGNESIUM: MAGNESIUM: 1.9 mg/dL (ref 1.7–2.4)

## 2017-02-14 LAB — FOLATE: Folate: 29 ng/mL (ref >5.9)

## 2017-02-15 LAB — VITAMIN D 25 HYDROXY (VIT D DEFICIENCY, FRACTURES): Vit D, 25-Hydroxy: 29.2 ng/mL — ABNORMAL LOW (ref 30.0–100.0)

## 2017-02-22 DIAGNOSIS — F0390 Unspecified dementia without behavioral disturbance: Secondary | ICD-10-CM

## 2017-02-22 DIAGNOSIS — I10 Essential (primary) hypertension: Secondary | ICD-10-CM

## 2017-02-22 DIAGNOSIS — F325 Major depressive disorder, single episode, in full remission: Secondary | ICD-10-CM

## 2017-02-22 DIAGNOSIS — I2699 Other pulmonary embolism without acute cor pulmonale: Secondary | ICD-10-CM

## 2017-02-22 HISTORY — DX: Major depressive disorder, single episode, in full remission: F32.5

## 2017-02-22 HISTORY — DX: Other pulmonary embolism without acute cor pulmonale: I26.99

## 2017-02-22 HISTORY — DX: Essential (primary) hypertension: I10

## 2017-02-22 HISTORY — DX: Unspecified dementia, unspecified severity, without behavioral disturbance, psychotic disturbance, mood disturbance, and anxiety: F03.90

## 2017-02-24 ENCOUNTER — Other Ambulatory Visit
Admission: RE | Admit: 2017-02-24 | Discharge: 2017-02-24 | Disposition: A | Discharge status: Home / Self Care | Payer: Medicare Other | Source: Ambulatory Visit | Attending: Internal Medicine | Admitting: Internal Medicine

## 2017-02-24 DIAGNOSIS — I2699 Other pulmonary embolism without acute cor pulmonale: Secondary | ICD-10-CM | POA: Diagnosis present

## 2017-02-24 LAB — CBC WITH DIFFERENTIAL/PLATELET
BASOS PCT: 1 %
Basophils Absolute: 0 10*3/uL (ref 0–0.1)
EOS ABS: 0.2 10*3/uL (ref 0–0.7)
EOS PCT: 3 %
HCT: 41.1 % (ref 40.0–52.0)
HEMOGLOBIN: 14 g/dL (ref 13.0–18.0)
Lymphocytes Relative: 18 %
Lymphs Abs: 1 10*3/uL (ref 1.0–3.6)
MCH: 27.8 pg (ref 26.0–34.0)
MCHC: 33.9 g/dL (ref 32.0–36.0)
MCV: 82 fL (ref 80.0–100.0)
Monocytes Absolute: 0.7 10*3/uL (ref 0.2–1.0)
Monocytes Relative: 12 %
NEUTROS PCT: 66 %
Neutro Abs: 3.8 10*3/uL (ref 1.4–6.5)
PLATELETS: 165 10*3/uL (ref 150–440)
RBC: 5.01 MIL/uL (ref 4.40–5.90)
RDW: 16.3 % — ABNORMAL HIGH (ref 11.5–14.5)
WBC: 5.7 10*3/uL (ref 3.8–10.6)

## 2017-02-24 LAB — COMPREHENSIVE METABOLIC PANEL
ALBUMIN: 3.1 g/dL — AB (ref 3.5–5.0)
ALK PHOS: 61 U/L (ref 38–126)
ALT: 22 U/L (ref 17–63)
ANION GAP: 6 (ref 5–15)
AST: 16 U/L (ref 15–41)
BUN: 17 mg/dL (ref 6–20)
CALCIUM: 8.4 mg/dL — AB (ref 8.9–10.3)
CHLORIDE: 107 mmol/L (ref 101–111)
CO2: 24 mmol/L (ref 22–32)
Creatinine, Ser: 0.76 mg/dL (ref 0.61–1.24)
GFR calc non Af Amer: >60 mL/min (ref >60)
GLUCOSE: 95 mg/dL (ref 65–99)
Potassium: 4 mmol/L (ref 3.5–5.1)
SODIUM: 137 mmol/L (ref 135–145)
Total Bilirubin: 0.5 mg/dL (ref 0.3–1.2)
Total Protein: 6.1 g/dL — ABNORMAL LOW (ref 6.5–8.1)

## 2017-03-13 ENCOUNTER — Encounter
Admission: RE | Admit: 2017-03-13 | Discharge: 2017-03-13 | Disposition: A | Discharge status: Home / Self Care | Payer: Medicare Other | Source: Ambulatory Visit | Attending: Internal Medicine | Admitting: Internal Medicine

## 2017-03-23 ENCOUNTER — Non-Acute Institutional Stay (SKILLED_NURSING_FACILITY): Payer: Medicare Other | Admitting: Gerontology

## 2017-03-23 ENCOUNTER — Emergency Department: Payer: Medicare Other

## 2017-03-23 ENCOUNTER — Emergency Department
Admission: EM | Admit: 2017-03-23 | Discharge: 2017-03-23 | Disposition: A | Discharge status: Home / Self Care | Payer: Medicare Other | Attending: Emergency Medicine | Admitting: Emergency Medicine

## 2017-03-23 DIAGNOSIS — J189 Pneumonia, unspecified organism: Secondary | ICD-10-CM | POA: Diagnosis not present

## 2017-03-23 DIAGNOSIS — Z79899 Other long term (current) drug therapy: Secondary | ICD-10-CM | POA: Diagnosis not present

## 2017-03-23 DIAGNOSIS — R569 Unspecified convulsions: Secondary | ICD-10-CM | POA: Diagnosis not present

## 2017-03-23 DIAGNOSIS — G309 Alzheimer's disease, unspecified: Secondary | ICD-10-CM | POA: Diagnosis not present

## 2017-03-23 DIAGNOSIS — R55 Syncope and collapse: Secondary | ICD-10-CM | POA: Insufficient documentation

## 2017-03-23 DIAGNOSIS — I10 Essential (primary) hypertension: Secondary | ICD-10-CM | POA: Insufficient documentation

## 2017-03-23 LAB — CBC WITH DIFFERENTIAL/PLATELET
Basophils Absolute: 0 K/uL (ref 0–0.1)
Basophils Relative: 0 %
Eosinophils Absolute: 0 K/uL (ref 0–0.7)
Eosinophils Relative: 0 %
HCT: 39.1 % — ABNORMAL LOW (ref 40.0–52.0)
Hemoglobin: 12.8 g/dL — ABNORMAL LOW (ref 13.0–18.0)
Lymphocytes Relative: 3 %
Lymphs Abs: 0.3 K/uL — ABNORMAL LOW (ref 1.0–3.6)
MCH: 27.7 pg (ref 26.0–34.0)
MCHC: 32.8 g/dL (ref 32.0–36.0)
MCV: 84.5 fL (ref 80.0–100.0)
Monocytes Absolute: 0.1 K/uL — ABNORMAL LOW (ref 0.2–1.0)
Monocytes Relative: 1 %
Neutro Abs: 9.7 K/uL — ABNORMAL HIGH (ref 1.4–6.5)
Neutrophils Relative %: 96 %
Platelets: 143 K/uL — ABNORMAL LOW (ref 150–440)
RBC: 4.63 MIL/uL (ref 4.40–5.90)
RDW: 16.2 % — ABNORMAL HIGH (ref 11.5–14.5)
WBC: 10.1 K/uL (ref 3.8–10.6)

## 2017-03-23 LAB — URINALYSIS, COMPLETE (UACMP) WITH MICROSCOPIC
Bacteria, UA: NONE SEEN
Bilirubin Urine: NEGATIVE
GLUCOSE, UA: NEGATIVE mg/dL
Hgb urine dipstick: NEGATIVE
KETONES UR: NEGATIVE mg/dL
LEUKOCYTES UA: NEGATIVE
Nitrite: NEGATIVE
PH: 7 (ref 5.0–8.0)
Protein, ur: NEGATIVE mg/dL
SQUAMOUS EPITHELIAL / LPF: NONE SEEN
Specific Gravity, Urine: 1.017 (ref 1.005–1.030)

## 2017-03-23 LAB — COMPREHENSIVE METABOLIC PANEL WITH GFR
ALT: 32 U/L (ref 17–63)
AST: 39 U/L (ref 15–41)
Albumin: 3.4 g/dL — ABNORMAL LOW (ref 3.5–5.0)
Alkaline Phosphatase: 78 U/L (ref 38–126)
Anion gap: 9 (ref 5–15)
BUN: 16 mg/dL (ref 6–20)
CO2: 23 mmol/L (ref 22–32)
Calcium: 8.8 mg/dL — ABNORMAL LOW (ref 8.9–10.3)
Chloride: 105 mmol/L (ref 101–111)
Creatinine, Ser: 0.96 mg/dL (ref 0.61–1.24)
GFR calc Af Amer: >60 mL/min
GFR calc non Af Amer: >60 mL/min
Glucose, Bld: 111 mg/dL — ABNORMAL HIGH (ref 65–99)
Potassium: 4 mmol/L (ref 3.5–5.1)
Sodium: 137 mmol/L (ref 135–145)
Total Bilirubin: 0.9 mg/dL (ref 0.3–1.2)
Total Protein: 6.6 g/dL (ref 6.5–8.1)

## 2017-03-23 LAB — TROPONIN I
Troponin I: <0.03 ng/mL (ref <0.03)
Troponin I: <0.03 ng/mL (ref <0.03)

## 2017-03-23 MED ORDER — SODIUM CHLORIDE 0.9 % IV BOLUS (SEPSIS)
1000.0000 mL | Freq: Once | INTRAVENOUS | Status: AC
  Administered 2017-03-23: 1000 mL via INTRAVENOUS

## 2017-03-23 MED ORDER — DOXYCYCLINE HYCLATE 100 MG PO CAPS
100.0000 mg | ORAL_CAPSULE | Freq: Two times a day (BID) | ORAL | 0 refills | Status: AC
Start: 2017-03-23 — End: 2017-03-30

## 2017-03-23 MED ORDER — DOXYCYCLINE HYCLATE 100 MG PO CAPS: 100.0000 mg | ORAL_CAPSULE | Freq: Two times a day (BID) | ORAL | 0 refills | Status: DC

## 2017-03-23 MED ORDER — LEVOFLOXACIN IN D5W 500 MG/100ML IV SOLN
500.0000 mg | Freq: Once | INTRAVENOUS | Status: AC
  Administered 2017-03-23: 500 mg via INTRAVENOUS
  Filled 2017-03-23: qty 100

## 2017-03-23 NOTE — ED Provider Notes (Addendum)
-----------------------------------------   4:24 PM on 03/23/2017 -----------------------------------------   Blood pressure (!) 143/69, pulse 100, temperature 99.8 F (37.7 C), temperature source Rectal, resp. rate 18, height 5\' 10"  (1.778 m), weight 170 lb (77.1 kg), SpO2 94 %.  Assuming care from Dr. of [facility] Oquinn is a 83 y.o. male with a chief complaint of Seizures .    In summary, 1M hx of dementia, HTN, PE on eliquis, Here presenting with seizure vs syncope in the setting of a few days of fever. Patient found to have PNA on CXR and started on levaquin by Dr. 83. I was asked to follow up 2nd trop pending at 5PM.  _________________________ 7:40 PM on 03/23/2017 ----------------------------------------- Vitals:   03/23/17 1930 03/23/17 1940  BP: 95/71 103/61  Pulse: 95 94  Resp: 18 16  Temp:     Patient's labs work with no acute findings. Dr. 05/23/17 recommended discharging him on levaquin which will be prescribed. Patient remains afebrile with no arrhythmias on telemetry. Vitals improved after IVF. Daughter will spend the night with patient at the SNF and patient will be seen by NP tomorrow at [facility].    [facility], MD 03/23/17 1941    05/23/17, MD 03/23/17 [phone]

## 2017-03-23 NOTE — ED Notes (Addendum)
Pt came from [facility]	[place] - per pt daughter and POA they will need EMS to transport back to facility upon discharge

## 2017-03-23 NOTE — ED Triage Notes (Signed)
Pt arrived via ems for c/o seizure activity - staff at [facility] stated pt had ""shaking episode" and was removed from the dining room and taking back to his room - once he arrived back to his room pt had returned to normal baseline for him and no further shaking noted - pt denies pain and no other symptoms noted

## 2017-03-23 NOTE — ED Notes (Signed)
Pt arrived via ems for c/o seizure activity - staff at [facility] stated pt had ""shaking episode" and was removed from the dining room and taking back to his room - once he arrived back to his room pt had returned to normal baseline for him and no further shaking noted - pt denies pain and no other symptoms noted 

## 2017-03-23 NOTE — ED Provider Notes (Addendum)
[facility]-EMERGENCY DEPARTMENT Provider Note   CSN: [id number] Arrival date & time: 03/23/17  1347     History   Chief Complaint Chief Complaint  Patient presents with  . Seizures    HPI Burlon Sama is a 83 y.o. male hx of dementia, HTN, PE on eliquis, Here presenting with seizure like activity. Patient is currently living in a nursing home. Patient walked to the dining hall and was noted to have a shaking episode that lasted less than a minute. His by staff and his daughter. Patient does not remember anything but is demented at baseline. Patient has no history of seizures in the past. Denies hx of CAD. 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 Denies chest pain prior to the episode.   The history is provided by the patient and a relative.   Level V caveat- dementia   Past Medical History:  Diagnosis Date  . Alzheimer's dementia   . Hypertension   . RA (rheumatoid arthritis) [facility])     Patient Active Problem List   Diagnosis Date Noted  . Late onset Alzheimer's disease without behavioral disturbance 01/10/2017  . High risk medication use 01/10/2017  . Depression 11/03/2016  . Pulmonary embolism (HCC) 10/30/2016  . Pulmonary nodules 10/30/2016  . HTN (hypertension) 10/30/2016  . Alzheimer's dementia 10/30/2016  . RA (rheumatoid arthritis) (HCC) 10/30/2016    Past Surgical History:  Procedure Laterality Date  . JOINT REPLACEMENT     left hip replacement 09-19-16       Home Medications    Prior to Admission medications   Medication Sig Start Date End Date Taking? Authorizing Provider  apixaban (ELIQUIS) 5 MG TABS tablet Take 1 tablet (5 mg total) by mouth 2 (two) times daily. Start after 10 mg tabs are finished 11/09/16  Yes Calvert Cantor, MD  calcium-vitamin D (OSCAL WITH D) 500-200 MG-UNIT tablet Take 1 tablet by mouth 2 (two) times daily.   Yes Historical Provider, MD  citalopram (CELEXA) 10 MG tablet Take 10 mg by mouth  daily.   Yes Historical Provider, MD  cyanocobalamin 1000 MCG tablet Take 1,000 mcg by mouth daily. Give 2 tablet by mouth one time a day for Supplement    Yes Historical Provider, MD  donepezil (ARICEPT) 10 MG tablet Take 10 mg by mouth daily.   Yes Historical Provider, MD  folic acid (FOLVITE) 1 MG tablet Take 1 mg by mouth daily.   Yes Historical Provider, MD  losartan (COZAAR) 100 MG tablet Take 100 mg by mouth daily.   Yes Historical Provider, MD  methotrexate (RHEUMATREX) 2.5 MG tablet Take 2.5 mg by mouth once a week. Caution:Chemotherapy. Protect from light.  Pt takes on Tuesday evenings... Was changed by MD- per pts daughter   Yes Historical Provider, MD    Family History Family History  Problem Relation Age of Onset  . Other Father     On coumadin    Social History Social History  Substance Use Topics  . Smoking status: Never Smoker  . Smokeless tobacco: Never Used  . Alcohol use No     Allergies   Penicillins   Review of Systems Review of Systems  Neurological: Positive for seizures.  All other systems reviewed and are negative.    Physical Exam Updated Vital Signs BP (!) 143/69 (BP Location: Left Arm)   Pulse 100   Temp 99.8 F (37.7 C) (Rectal)   Resp 18   Ht 5\' 10"  (1.778  m)   Wt 170 lb (77.1 kg)   SpO2 94%   BMI 24.39 kg/m   Physical Exam  Constitutional:  Demented, NAD   HENT:  Head: Normocephalic.  Mouth/Throat: Oropharynx is clear and moist.  Eyes: EOM are normal. Pupils are equal, round, and reactive to light.  Neck: Normal range of motion. Neck supple.  Cardiovascular: Normal rate, regular rhythm and normal heart sounds.   Pulmonary/Chest: Effort normal and breath sounds normal. No respiratory distress. He has no wheezes.  Abdominal: Soft. Bowel sounds are normal. He exhibits no distension. There is no tenderness.  Musculoskeletal: Normal range of motion.  Neurological: He is alert.  Alert and oriented x 1 (baseline), nl strength  throughout.   Skin: Skin is warm.  Psychiatric: He has a normal mood and affect.  Nursing note and vitals reviewed.    ED Treatments / Results  Labs (all labs ordered are listed, but only abnormal results are displayed) Labs Reviewed  CBC WITH DIFFERENTIAL/PLATELET - Abnormal; Notable for the following:       Result Value   Hemoglobin 12.8 (*)    HCT 39.1 (*)    RDW 16.2 (*)    Platelets 143 (*)    Neutro Abs 9.7 (*)    Lymphs Abs 0.3 (*)    Monocytes Absolute 0.1 (*)    All other components within normal limits  COMPREHENSIVE METABOLIC PANEL - Abnormal; Notable for the following:    Glucose, Bld 111 (*)    Calcium 8.8 (*)    Albumin 3.4 (*)    All other components within normal limits  TROPONIN I  URINALYSIS, COMPLETE (UACMP) WITH MICROSCOPIC  TROPONIN I  CBG MONITORING, ED    EKG  EKG Interpretation None      ED ECG REPORT I, Richardean Canal, the attending physician, personally viewed and interpreted this ECG.   Date: 03/23/2017  EKG Time: 14:01 pm  Rate: 102  Rhythm: sinus tachycardia  Axis: normal  Intervals:none  ST&T Change: nonspecific    Radiology Dg Chest 2 View  Result Date: 03/23/2017 CLINICAL DATA:  Productive cough, seizure activity. EXAM: CHEST  2 VIEW COMPARISON:  Chest x-ray of October 30, 2016 FINDINGS: The lungs are hypoinflated. There is mild elevation of the left hemidiaphragm which is chronic. There is subtle increased density at the left lung base. The interstitial markings of both lungs are coarse. The cardiac silhouette is normal in size. The pulmonary vascularity is not engorged. There is calcification in the wall of the thoracic aorta. The bony thorax exhibits no acute abnormality. IMPRESSION: Left lower lobe atelectasis or possibly early pneumonia. The findings are accentuated by hypoinflation. Followup PA and lateral chest X-ray is recommended in 3-4 weeks following trial of antibiotic therapy to ensure resolution and exclude underlying  malignancy. Thoracic aortic atherosclerosis. Electronically Signed   By: Siddhi Dornbush  [place] M.D.   On: 03/23/2017 15:24   Ct Head Wo Contrast  Result Date: 03/23/2017 CLINICAL DATA:  Seizure EXAM: CT HEAD WITHOUT CONTRAST TECHNIQUE: Contiguous axial images were obtained from the base of the skull through the vertex without intravenous contrast. COMPARISON:  October 15, 2016 FINDINGS: Brain: Moderate diffuse atrophy is stable. There is no intracranial mass, hemorrhage, extra-axial fluid collection, or midline shift. There is small vessel disease throughout the centra semiovale bilaterally. There is evidence of a prior infarct in the medial aspect of the inferior left frontal lobe, stable. There is no new gray-white compartment lesion. No acute infarct is demonstrable. Vascular: There  is no evident hyperdense vessel. There is calcification in each carotid siphon region. There is calcification in the proximal basilar artery and in each distal vertebral artery. Skull: Bony calvarium appears intact. Sinuses/Orbits: There is mucosal thickening in multiple ethmoid air cells bilaterally. There are areas of mucosal thickening in each maxillary antrum. Other visualized paranasal sinuses are clear. Orbits appear symmetric bilaterally except for evidence of previous cataract removal on the left. Other: Mastoid air cells are clear. IMPRESSION: Stable diffuse atrophy. Extensive periventricular small vessel disease, stable. Prior frontal lobe infarct. No intracranial mass, hemorrhage, or evidence of acute infarct. Foci of arterial vascular calcification noted. There are areas of paranasal sinus disease. Electronically Signed   By: Bretta Bang III M.D.   On: 03/23/2017 14:24    Procedures Procedures (including critical care time)  Medications Ordered in ED Medications - No data to display   Initial Impression / Assessment and Plan / ED Course  I have reviewed the triage vital signs and the nursing  notes.  Pertinent labs & imaging results that were available during my care of the patient were reviewed by me and considered in my medical decision making (see chart for details).    Mikias Lanz is a 83 y.o. male here with possible seizure. Patient on eliquis due to previous PE. No hx of seizure and patient demented. Back to baseline as per family at the bedside. Will get labs, CT head, EKG.   3:33 PM CT head unremarkable. Rectal temp 99. I called Dr. Loretha Brasil, neurology, who doesn't want to start any antiepileptics due to potential side effects and rather have patient follow up outpatient. Daughter at bedside states that she witnessed the episode and not sure if he passed out. No hx of CAD. CXR showed possible LLL pneumonia. UA pending. Ordered levaquin, WBC nl, not hypoxic. If repeat trop at 5pm unremarkable, can be discharged with levaquin. Dr. Don Perking in the ED aware.    Final Clinical Impressions(s) / ED Diagnoses   Final diagnoses:  None    New Prescriptions New Prescriptions   No medications on file     Charlynne Pander, MD 03/23/17 1536    Charlynne Pander, MD 03/23/17 1537

## 2017-03-23 NOTE — ED Notes (Signed)
Assisted to use urinal to void and then [place]wich tray given

## 2017-03-23 NOTE — ED Notes (Signed)
Secretary notified of need for transport back to nursing home via ems

## 2017-03-23 NOTE — ED Notes (Signed)
Checked pt before transport. Pt was not wet.

## 2017-03-23 NOTE — Discharge Instructions (Signed)

## 2017-04-01 DIAGNOSIS — R569 Unspecified convulsions: Secondary | ICD-10-CM | POA: Insufficient documentation

## 2017-04-01 NOTE — Progress Notes (Signed)
Location:      Place of Service:  SNF (31) Provider:  Toni Arthurs, NP-C  Chriss Czar, MD  Patient Care Team: Chriss Czar, MD as PCP - General (Family Medicine)  Extended Emergency Contact Information Primary Emergency Contact: Caviness,Carol Address: [address]          [place], [place] [address] [place] Phone: [phone] Relation: Other  Code Status:  DNR Goals of care: Advanced Directive information Advanced Directives 03/23/2017  Does Patient Have a Medical Advance Directive? Yes  Type of Advance Directive Out of facility DNR (pink MOST or yellow form)  Does patient want to make changes to medical advance directive? -  Copy of [place] in Chart? -  Pre-existing out of facility DNR order (yellow form or pink MOST form) -     Chief Complaint  Patient presents with  . Acute Visit    HPI:  Pt is a 83 y.o. male seen today for an acute visit for seizure-like activity. Called urgently to the room by nursing. Pt was in the dining room and began having tremors and weak appearing. Then, pt lost consciousness and began having worsening tremors, non-focal gaze. Pt was pale, diaphoretic. Nursing had to physically lift pt out of wheelchair and place on the bed. Once pt was supine, legs were elevated. Pt's LOC improved. Pt began minimally conversing, answering questions appropriately. Pt remained pale and tachypneic; falling asleep easily. Only able to obtain minimal ROS at the time d/t lethargy/ ?post-ictal. Vitals were stable. After approximately 10 minutes, pt began having tremors again. Daughter then requested he be sent out to the ED for evaluation. Will arrange for transport.     Past Medical History:  Diagnosis Date  . Alzheimer's dementia   . Hypertension   . RA (rheumatoid arthritis) ([place])    Past Surgical History:  Procedure Laterality Date  . JOINT REPLACEMENT     left hip replacement 09-19-16    Allergies  Allergen  Reactions  . Penicillins Other (See Comments)    Family thinks pt is allergic to penicillin but not sure     Allergies as of 03/23/2017      Reactions   Penicillins Other (See Comments)   Family thinks pt is allergic to penicillin but not sure       Medication List       Accurate as of 03/23/17 11:59 PM. Always use your most recent med list.          apixaban 5 MG Tabs tablet Commonly known as:  ELIQUIS Take 1 tablet (5 mg total) by mouth 2 (two) times daily. Start after 10 mg tabs are finished   calcium-vitamin D 500-200 MG-UNIT tablet Commonly known as:  OSCAL WITH D Take 1 tablet by mouth 2 (two) times daily.   citalopram 10 MG tablet Commonly known as:  CELEXA Take 10 mg by mouth daily.   cyanocobalamin 1000 MCG tablet Take 1,000 mcg by mouth daily. Give 2 tablet by mouth one time a day for Supplement   donepezil 10 MG tablet Commonly known as:  ARICEPT Take 10 mg by mouth daily.   doxycycline 100 MG capsule Commonly known as:  VIBRAMYCIN Take 1 capsule (100 mg total) by mouth 2 (two) times daily.   folic acid 1 MG tablet Commonly known as:  FOLVITE Take 1 mg by mouth daily.   losartan 100 MG tablet Commonly known as:  COZAAR Take 100 mg by mouth daily.   methotrexate  2.5 MG tablet Commonly known as:  RHEUMATREX Take 2.5 mg by mouth once a week. Caution:Chemotherapy. Protect from light.  Pt takes on Tuesday evenings... Was changed by MD- per pts daughter       Review of Systems  Constitutional: Negative for chills and diaphoresis.  Eyes: Negative for pain, redness and visual disturbance.  Respiratory: Negative for cough, choking, chest tightness, shortness of breath and wheezing.   Cardiovascular: Negative for chest pain.  Gastrointestinal: Negative for constipation, diarrhea and nausea.  Genitourinary: Negative for difficulty urinating, dysuria, frequency and urgency.  Musculoskeletal: Arthralgias: typical arthritis.  Skin: Negative for color  change, pallor, rash and wound.  Neurological: Positive for tremors, seizures, syncope and weakness. Negative for dizziness, speech difficulty, numbness and headaches.  Psychiatric/Behavioral: Negative for agitation and behavioral problems.  All other systems reviewed and are negative.    There is no immunization history on file for this patient. Pertinent  Health Maintenance Due  Topic Date Due  . PNA vac Low Risk Adult (1 of 2 - PCV13) 08/22/1996  . INFLUENZA VACCINE  07/13/2017   No flowsheet data found. Functional Status Survey:    Vitals:   03/23/17 1300  BP: (!) 133/55  Pulse: 88  Resp: 18  Temp: 98.7 F (37.1 C)  SpO2: 97%   There is no height or weight on file to calculate BMI. Physical Exam  Constitutional: He is oriented to person, place, and time. Vital signs are normal. He appears well-developed and well-nourished. He appears lethargic. He is active and cooperative. He does not appear ill. No distress.  HENT:  Head: Normocephalic and atraumatic.  Mouth/Throat: Uvula is midline, oropharynx is clear and moist and mucous membranes are normal. Mucous membranes are not pale, not dry and not cyanotic.  Eyes: Conjunctivae, EOM and lids are normal. Pupils are equal, round, and reactive to light.  Neck: Trachea normal, normal range of motion and full passive range of motion without pain. Neck supple. No JVD present. No tracheal deviation, no edema and no erythema present. No thyromegaly present.  Cardiovascular: Normal rate, regular rhythm, normal heart sounds, intact distal pulses and normal pulses.  Exam reveals no gallop, no distant heart sounds and no friction rub.   No murmur heard. Pulses:      Dorsalis pedis pulses are 2+ on the right side, and 2+ on the left side.  Pulmonary/Chest: Effort normal and breath sounds normal. No accessory muscle usage. No respiratory distress. He has no decreased breath sounds. He has no wheezes. He has no rhonchi. He has no rales. He  exhibits no tenderness.  Abdominal: Soft. Normal appearance and bowel sounds are normal. He exhibits no distension and no ascites. There is no tenderness.  Musculoskeletal: Normal range of motion. He exhibits no edema or tenderness.  Expected osteoarthritis, stiffness  Neurological: He is oriented to person, place, and time. He has normal strength. He appears lethargic. He displays tremor. He displays seizure activity. Coordination abnormal.  Skin: Skin is warm and intact. He is diaphoretic. No cyanosis. There is pallor. Nails show no clubbing.  Psychiatric: He has a normal mood and affect. His speech is normal and behavior is normal. Judgment and thought content normal. Cognition and memory are normal.  Nursing note and vitals reviewed.   Labs reviewed:  Recent Labs  11/01/16 0353 11/02/16 0403  02/14/17 0600 02/24/17 0450 03/23/17 1400  NA  --  138  < > 138 137 137  K  --  4.3  < > 4.1  4.0 4.0  CL  --  106  --  107 107 105  CO2  --  27  --  25 24 23   GLUCOSE  --  115*  --  96 95 111*  BUN  --  17  < > 12 17 16   CREATININE  --  1.04  < > 0.75 0.76 0.96  CALCIUM  --  9.1  --  8.6* 8.4* 8.8*  MG 1.9 2.0  --  1.9  --   --   PHOS 3.2 3.8  --   --   --   --   < > = values in this interval not displayed.  Recent Labs  02/14/17 0600 02/24/17 0450 03/23/17 1400  AST 19 16 39  ALT 21 22 32  ALKPHOS 67 61 78  BILITOT 0.8 0.5 0.9  PROT 6.1* 6.1* 6.6  ALBUMIN 3.1* 3.1* 3.4*    Recent Labs  02/14/17 0600 02/24/17 0450 03/23/17 1400  WBC 5.7 5.7 10.1  NEUTROABS 3.8 3.8 9.7*  HGB 14.1 14.0 12.8*  HCT 41.7 41.1 39.1*  MCV 81.6 82.0 84.5  PLT 142* 165 143*   Lab Results  Component Value Date   TSH 2.980 02/14/2017   No results found for: HGBA1C No results found for: CHOL, HDL, LDLCALC, LDLDIRECT, TRIG, CHOLHDL  Significant Diagnostic Results in last 30 days:  Dg Chest 2 View  Result Date: 03/23/2017 CLINICAL DATA:  Productive cough, seizure activity. EXAM: CHEST  2  VIEW COMPARISON:  Chest x-ray of October 30, 2016 FINDINGS: The lungs are hypoinflated. There is mild elevation of the left hemidiaphragm which is chronic. There is subtle increased density at the left lung base. The interstitial markings of both lungs are coarse. The cardiac silhouette is normal in size. The pulmonary vascularity is not engorged. There is calcification in the wall of the thoracic aorta. The bony thorax exhibits no acute abnormality. IMPRESSION: Left lower lobe atelectasis or possibly early pneumonia. The findings are accentuated by hypoinflation. Followup PA and lateral chest X-ray is recommended in 3-4 weeks following trial of antibiotic therapy to ensure resolution and exclude underlying malignancy. Thoracic aortic atherosclerosis. Electronically Signed   By: David  Martinique M.D.   On: 03/23/2017 15:24   Ct Head Wo Contrast  Result Date: 03/23/2017 CLINICAL DATA:  Seizure EXAM: CT HEAD WITHOUT CONTRAST TECHNIQUE: Contiguous axial images were obtained from the base of the skull through the vertex without intravenous contrast. COMPARISON:  October 15, 2016 FINDINGS: Brain: Moderate diffuse atrophy is stable. There is no intracranial mass, hemorrhage, extra-axial fluid collection, or midline shift. There is small vessel disease throughout the centra semiovale bilaterally. There is evidence of a prior infarct in the medial aspect of the inferior left frontal lobe, stable. There is no new gray-white compartment lesion. No acute infarct is demonstrable. Vascular: There is no evident hyperdense vessel. There is calcification in each carotid siphon region. There is calcification in the proximal basilar artery and in each distal vertebral artery. Skull: Bony calvarium appears intact. Sinuses/Orbits: There is mucosal thickening in multiple ethmoid air cells bilaterally. There are areas of mucosal thickening in each maxillary antrum. Other visualized paranasal sinuses are clear. Orbits appear symmetric  bilaterally except for evidence of previous cataract removal on the left. Other: Mastoid air cells are clear. IMPRESSION: Stable diffuse atrophy. Extensive periventricular small vessel disease, stable. Prior frontal lobe infarct. No intracranial mass, hemorrhage, or evidence of acute infarct. Foci of arterial vascular calcification noted. There are areas of paranasal  sinus disease. Electronically Signed   By: Lowella Grip III M.D.   On: 03/23/2017 14:24    Assessment/Plan 1. Seizure-like activity ([place])  Initially ordered labs and head CT  Seizure Precautions  Safety precautions  Family changed their minds about monitoring  Send to ED  Family/ staff Communication:   Total Time:  Documentation:  Face to Face:  Family/Phone: Daughter at bedside   Labs/tests ordered:  CBC, Met C, mag+, TSH, Prolactin level, CT head with contrast stat  Medication list reviewed and assessed for continued appropriateness.  Vikki Ports, NP-C Geriatrics [facility] [phone] [place], [place] [address] Cell Phone (Mon-Fri 8am-5pm):  [phone] On Call:  [phone] & follow prompts after 5pm & weekends Office Phone:  [phone] Office Fax:  [phone]

## 2017-04-12 ENCOUNTER — Encounter
Admission: RE | Admit: 2017-04-12 | Discharge: 2017-04-12 | Disposition: A | Discharge status: Home / Self Care | Payer: Medicare Other | Source: Ambulatory Visit | Attending: Internal Medicine | Admitting: Internal Medicine

## 2017-05-04 ENCOUNTER — Non-Acute Institutional Stay (SKILLED_NURSING_FACILITY): Payer: Medicare Other | Admitting: Gerontology

## 2017-05-04 DIAGNOSIS — F329 Major depressive disorder, single episode, unspecified: Secondary | ICD-10-CM

## 2017-05-04 DIAGNOSIS — E538 Deficiency of other specified B group vitamins: Secondary | ICD-10-CM

## 2017-05-04 DIAGNOSIS — I10 Essential (primary) hypertension: Secondary | ICD-10-CM | POA: Diagnosis not present

## 2017-05-04 DIAGNOSIS — I2782 Chronic pulmonary embolism: Secondary | ICD-10-CM

## 2017-05-04 DIAGNOSIS — F0280 Dementia in other diseases classified elsewhere without behavioral disturbance: Secondary | ICD-10-CM | POA: Diagnosis not present

## 2017-05-04 DIAGNOSIS — M069 Rheumatoid arthritis, unspecified: Secondary | ICD-10-CM

## 2017-05-04 DIAGNOSIS — G301 Alzheimer's disease with late onset: Secondary | ICD-10-CM | POA: Diagnosis not present

## 2017-05-04 DIAGNOSIS — R918 Other nonspecific abnormal finding of lung field: Secondary | ICD-10-CM

## 2017-05-09 DIAGNOSIS — E538 Deficiency of other specified B group vitamins: Secondary | ICD-10-CM | POA: Insufficient documentation

## 2017-05-09 DIAGNOSIS — F329 Major depressive disorder, single episode, unspecified: Secondary | ICD-10-CM | POA: Insufficient documentation

## 2017-05-09 DIAGNOSIS — F0280 Dementia in other diseases classified elsewhere without behavioral disturbance: Secondary | ICD-10-CM | POA: Insufficient documentation

## 2017-05-09 DIAGNOSIS — R918 Other nonspecific abnormal finding of lung field: Secondary | ICD-10-CM | POA: Insufficient documentation

## 2017-05-09 NOTE — Progress Notes (Signed)
Location:      Place of Service:  SNF (31) Provider:  Lorenso Quarry, NP-C  Nicolasa Ducking, MD  Patient Care Team: Nicolasa Ducking, MD as PCP - General [facility])  Extended Emergency Contact Information Primary Emergency Contact: Caviness,Carol Address: [address]          [place], [place] [address] [place] Home Phone: [phone] Relation: Other  Code Status:  DNR Goals of care: Advanced Directive information Advanced Directives 03/23/2017  Does Patient Have a Medical Advance Directive? Yes  Type of Advance Directive Out of facility DNR (pink MOST or yellow form)  Does patient want to make changes to medical advance directive? -  Copy of Healthcare Power of Attorney in Chart? -  Pre-existing out of facility DNR order (yellow form or pink MOST form) -     Chief Complaint  Patient presents with  . Medical Management of Chronic Issues    HPI:  Pt is a 83 y.o. male seen today for medical management of chronic diseases. Pt has been stable this month. No falls. Pt has been eating well. Having regular BMs. Pt enjoys socializing with other residents in the common areas. He is mobile on the unit in the wheelchair. No further issues with the "seizure-like"activity he experienced last month. Pt denies n/v/d/f/c/cp/sob/ha/abd pain/dizziness/cough. Denies pain. VSS. No other complaints.     Past Medical History:  Diagnosis Date  . Alzheimer's dementia   . Hypertension   . RA (rheumatoid arthritis) ([facility])    Past Surgical History:  Procedure Laterality Date  . JOINT REPLACEMENT     left hip replacement 09-19-16    Allergies  Allergen Reactions  . Penicillins Other (See Comments)    Family thinks pt is allergic to penicillin but not sure     Allergies as of 05/04/2017      Reactions   Penicillins Other (See Comments)   Family thinks pt is allergic to penicillin but not sure       Medication List       Accurate as of 05/04/17 11:59 PM. Always use your  most recent med list.          apixaban 5 MG Tabs tablet Commonly known as:  ELIQUIS Take 1 tablet (5 mg total) by mouth 2 (two) times daily. Start after 10 mg tabs are finished   calcium-vitamin D 500-200 MG-UNIT tablet Commonly known as:  OSCAL WITH D Take 1 tablet by mouth 2 (two) times daily.   citalopram 10 MG tablet Commonly known as:  CELEXA Take 10 mg by mouth daily.   cyanocobalamin 1000 MCG tablet Take 1,000 mcg by mouth daily. Give 2 tablet by mouth one time a day for Supplement   donepezil 10 MG tablet Commonly known as:  ARICEPT Take 10 mg by mouth daily.   folic acid 1 MG tablet Commonly known as:  FOLVITE Take 1 mg by mouth daily.   losartan 100 MG tablet Commonly known as:  COZAAR Take 100 mg by mouth daily.   methotrexate 2.5 MG tablet Commonly known as:  RHEUMATREX Take 2.5 mg by mouth once a week. Caution:Chemotherapy. Protect from light.  Pt takes on Tuesday evenings... Was changed by MD- per pts daughter       Review of Systems  Constitutional: Negative for chills and diaphoresis.  HENT: Negative.   Respiratory: Negative for cough, choking, chest tightness, shortness of breath and wheezing.   Cardiovascular: Negative for chest pain, palpitations and leg swelling.  Gastrointestinal:  Negative for constipation, diarrhea and nausea.  Genitourinary: Negative for difficulty urinating, dysuria, frequency and urgency.  Musculoskeletal: Negative.  Arthralgias: typical arthritis.  Skin: Negative for color change, pallor, rash and wound.  Neurological: Negative for dizziness, tremors, seizures, syncope, speech difficulty, weakness, numbness and headaches.  Psychiatric/Behavioral: Negative for agitation and behavioral problems.  All other systems reviewed and are negative.    There is no immunization history on file for this patient. Pertinent  Health Maintenance Due  Topic Date Due  . PNA vac Low Risk Adult (1 of 2 - PCV13) 08/22/1996  . INFLUENZA  VACCINE  07/13/2017   No flowsheet data found. Functional Status Survey:    Vitals:   05/03/17 0630  BP: (!) 156/83  Pulse: 78  Resp: 20  Temp: 97.5 F (36.4 C)  SpO2: 99%   There is no height or weight on file to calculate BMI. Physical Exam  Constitutional: He is oriented to person, place, and time. Vital signs are normal. He appears well-developed and well-nourished. He is active and cooperative. He does not appear ill. No distress.  HENT:  Head: Normocephalic and atraumatic.  Mouth/Throat: Uvula is midline, oropharynx is clear and moist and mucous membranes are normal. Mucous membranes are not pale, not dry and not cyanotic.  Eyes: Conjunctivae, EOM and lids are normal. Pupils are equal, round, and reactive to light.  Neck: Trachea normal, normal range of motion and full passive range of motion without pain. Neck supple. No JVD present. No tracheal deviation, no edema and no erythema present. No thyromegaly present.  Cardiovascular: Normal rate, regular rhythm, normal heart sounds, intact distal pulses and normal pulses.  Exam reveals no gallop, no distant heart sounds and no friction rub.   No murmur heard. Pulses:      Dorsalis pedis pulses are 2+ on the right side, and 2+ on the left side.  Pulmonary/Chest: Effort normal and breath sounds normal. No accessory muscle usage. No respiratory distress. He has no decreased breath sounds. He has no wheezes. He has no rhonchi. He has no rales. He exhibits no tenderness.  Abdominal: Soft. Normal appearance and bowel sounds are normal. He exhibits no distension and no ascites. There is no tenderness.  Musculoskeletal: Normal range of motion. He exhibits no edema or tenderness.  Expected osteoarthritis, stiffness  Neurological: He is alert and oriented to person, place, and time. He has normal strength. He displays no tremor. He displays no seizure activity.  Skin: Skin is warm and intact. No rash noted. He is not diaphoretic. No cyanosis  or erythema. No pallor. Nails show no clubbing.  Psychiatric: He has a normal mood and affect. His speech is normal and behavior is normal. Judgment and thought content normal. Cognition and memory are normal.  Nursing note and vitals reviewed.   Labs reviewed:  Recent Labs  11/01/16 0353 11/02/16 0403  02/14/17 0600 02/24/17 0450 03/23/17 1400  NA  --  138  < > 138 137 137  K  --  4.3  < > 4.1 4.0 4.0  CL  --  106  --  107 107 105  CO2  --  27  --  25 24 23   GLUCOSE  --  115*  --  96 95 111*  BUN  --  17  < > 12 17 16   CREATININE  --  1.04  < > 0.75 0.76 0.96  CALCIUM  --  9.1  --  8.6* 8.4* 8.8*  MG 1.9 2.0  --  1.9  --   --   PHOS 3.2 3.8  --   --   --   --   < > = values in this interval not displayed.  Recent Labs  02/14/17 0600 02/24/17 0450 03/23/17 1400  AST 19 16 39  ALT 21 22 32  ALKPHOS 67 61 78  BILITOT 0.8 0.5 0.9  PROT 6.1* 6.1* 6.6  ALBUMIN 3.1* 3.1* 3.4*    Recent Labs  02/14/17 0600 02/24/17 0450 03/23/17 1400  WBC 5.7 5.7 10.1  NEUTROABS 3.8 3.8 9.7*  HGB 14.1 14.0 12.8*  HCT 41.7 41.1 39.1*  MCV 81.6 82.0 84.5  PLT 142* 165 143*   Lab Results  Component Value Date   TSH 2.980 02/14/2017   No results found for: HGBA1C No results found for: CHOL, HDL, LDLCALC, LDLDIRECT, TRIG, CHOLHDL  Significant Diagnostic Results in last 30 days:  No results found.  Assessment/Plan 1. Late onset Alzheimer's disease without behavioral disturbance  Stable   Continue donepezil tablet; 10 mg po Q HS  2. Dementia associated with other underlying disease without behavioral disturbance  Stable  3. Major depressive disorder with single episode, remission status unspecified  Stable  Continue citalopram tablet; 10 mg; 1 tablet po Q Day  4. Chronic pulmonary thromboembolism syndrome (HCC)  Stable  Continue Eliquis (apixaban) tablet; 5 mg po BID  5. Other nonspecific abnormal finding of lung field  Stable  6. Rheumatoid arthritis  involving both hands, unspecified rheumatoid factor presence (HCC)  Stable  Continue calcium carbonate-vitamin D3 [OTC] tablet; 500 mg(1,250mg ) -200 unit; amt: 1 tablet PO BID  Continue folic acid [OTC] tablet; 1 mg PO Q Day  Continue methotrexate sodium tablet; 2.5 mg po Q Week on Tuesdays  7. Essential hypertension  Stable  Continue losartan tablet; 100 mg; 1 tablet po Q Day  8. Vitamin B12 deficiency  Stable  Continue cyanocobalamin (vitamin B-12) [OTC] tablet; 1,000 mcg 1 tablet po Q Day   Family/ staff Communication:   Total Time:  Documentation:  Face to Face:  Family/Phone:   Labs/tests ordered:    Medication list reviewed and assessed for continued appropriateness. Monthly medication orders reviewed and signed.  Brynda Rim, NP-C Geriatrics [facility] [phone], [place] [address] Cell Phone (Mon-Fri 8am-5pm):  [phone] On Call:  [phone] & follow prompts after 5pm & weekends Office Phone:  [phone] Office Fax:  [phone]

## 2017-05-13 ENCOUNTER — Encounter
Admission: RE | Admit: 2017-05-13 | Discharge: 2017-05-13 | Disposition: A | Discharge status: Home / Self Care | Payer: Medicare Other | Source: Ambulatory Visit | Attending: Internal Medicine | Admitting: Internal Medicine

## 2017-05-16 ENCOUNTER — Ambulatory Visit (INDEPENDENT_AMBULATORY_CARE_PROVIDER_SITE_OTHER): Payer: Medicare Other | Admitting: Podiatry

## 2017-05-16 ENCOUNTER — Encounter: Payer: Self-pay | Admitting: Podiatry

## 2017-05-16 VITALS — BP 133/98 | HR 85

## 2017-05-16 DIAGNOSIS — M79676 Pain in unspecified toe(s): Secondary | ICD-10-CM

## 2017-05-16 DIAGNOSIS — B351 Tinea unguium: Secondary | ICD-10-CM

## 2017-05-16 NOTE — Progress Notes (Signed)
   Subjective:    Patient ID: Joseph [name], male    DOB: April 07, 1929, 83 y.o.   MRN: [id number]  HPIThis patient presents to the office with painful big toenails both feet.  He lives at a facility and is brought in to the office by his daughter.  He says his big toenails are painful wearing his shoes.  He presents for evaluation and treatment.    Review of Systems  All other systems reviewed and are negative.      Objective:   Physical Exam GENERAL APPEARANCE: Alert, conversant. Appropriately groomed. No acute distress.  VASCULAR: Pedal pulses are  palpable at  [facility] and PT bilateral.  Capillary refill time is immediate to all digits,  Normal temperature gradient.    NEUROLOGIC: sensation is normal to 5.07 monofilament at 5/5 sites bilateral.  Light touch is intact bilateral, Muscle strength normal.  MUSCULOSKELETAL: acceptable muscle strength, tone and stability bilateral.  Intrinsic muscluature intact bilateral.  Rectus appearance of foot and digits noted bilateral. Hallux limitus  1st MPJ  B/L NAILS  Thick disfigured ingrown toenails  Hallux nails  B/L. DERMATOLOGIC: skin color, texture, and turgor are within normal limits.  No preulcerative lesions or ulcers  are seen, no interdigital maceration noted.  No open lesions present. . No drainage noted.         Assessment & Plan:  Onychomycosis  Ingrown Toenails  Hallux  B/L  IE  Debride nails.  RTC 3 months   Helane Gunther DPM

## 2017-06-12 ENCOUNTER — Encounter
Admission: RE | Admit: 2017-06-12 | Discharge: 2017-06-12 | Disposition: A | Discharge status: Home / Self Care | Payer: Medicare Other | Source: Ambulatory Visit | Attending: Internal Medicine | Admitting: Internal Medicine

## 2017-06-16 ENCOUNTER — Non-Acute Institutional Stay (SKILLED_NURSING_FACILITY): Payer: Medicare Other | Admitting: Gerontology

## 2017-06-16 DIAGNOSIS — K0889 Other specified disorders of teeth and supporting structures: Secondary | ICD-10-CM | POA: Diagnosis not present

## 2017-06-17 ENCOUNTER — Encounter: Payer: Self-pay | Admitting: Gerontology

## 2017-06-17 NOTE — Progress Notes (Signed)
[facility]:   [facility] Room Number: 308A Place of Service:  SNF [phone]) Provider:  Lorenso Quarry, NP-C  Einar Crow, MD  Patient Care Team: System, Pcp Not In as PCP - General  Extended Emergency Contact Information Primary Emergency Contact: Caviness,Carol Address: [address]          [place], [place] [address] [place] Home Phone: [phone] Relation: Other  Code Status:  DNR Goals of care: Advanced Directive information Advanced Directives 06/16/2017  Does Patient Have a Medical Advance Directive? Yes  Type of Advance Directive Out of facility DNR (pink MOST or yellow form)  Does patient want to make changes to medical advance directive? No - Patient declined  Copy of Healthcare Power of Attorney in Chart? -  Pre-existing out of facility DNR order (yellow form or pink MOST form) -     Chief Complaint  Patient presents with  . Acute Visit    Acute    HPI:  Pt is a 83 y.o. male seen today for an acute visit for tooth pain. Pt is sitting very still with his dead down, holding the side of his jaw. This is unusual as the pt is usually very social. The right lower jaw was very painful. Molar was dark gray, gums were red, and mushy. Nursing to call Dentist for urgent referral for tooth extraction. Unable to obtain complete ROS due to pt's pain at this time. VSS.    Past Medical History:  Diagnosis Date  . Alzheimer's dementia   . Dementia arising in the senium and presenium 02/22/2017  . HTN, goal below 150/90 02/22/2017   Losartan  . Hypertension   . Major depression in remission (HCC) 02/22/2017  . Pulmonary embolism without acute cor pulmonale (HCC) 02/22/2017  . RA (rheumatoid arthritis) (HCC)    Past Surgical History:  Procedure Laterality Date  . JOINT REPLACEMENT     left hip replacement 09-19-16    Allergies  Allergen Reactions  . Morphine Sulfate   . Penicillins Other (See Comments)    Family thinks pt is allergic  to penicillin but not sure     Allergies as of 06/16/2017      Reactions   Penicillins Other (See Comments)   Family thinks pt is allergic to penicillin but not sure       Medication List       Accurate as of 06/16/17 11:59 PM. Always use your most recent med list.          acetaminophen 325 MG tablet Commonly known as:  TYLENOL Take 650 mg by mouth 4 (four) times daily. 8 am, 1 pm, 5 pm, 9 pm   apixaban 5 MG Tabs tablet Commonly known as:  ELIQUIS Take 1 tablet (5 mg total) by mouth 2 (two) times daily. Start after 10 mg tabs are finished   calcium-vitamin D 500-200 MG-UNIT tablet Commonly known as:  OSCAL WITH D Take 1 tablet by mouth 2 (two) times daily.   citalopram 10 MG tablet Commonly known as:  CELEXA Take 10 mg by mouth daily.   cyanocobalamin 1000 MCG tablet Take 1,000 mcg by mouth daily.   donepezil 10 MG tablet Commonly known as:  ARICEPT Take 10 mg by mouth daily.   folic acid 1 MG tablet Commonly known as:  FOLVITE Take 1 mg by mouth daily.   losartan 100 MG tablet Commonly known as:  COZAAR Take 100 mg by mouth daily.   methotrexate 2.5  MG tablet Commonly known as:  RHEUMATREX Take 2.5 mg by mouth once a week. Caution:Chemotherapy. Protect from light.  Pt takes on Tuesday evenings... Was changed by MD- per pts daughter   traMADol 50 MG tablet Commonly known as:  ULTRAM Take 50 mg by mouth every 4 (four) hours as needed.       Review of Systems  Constitutional: Positive for activity change and appetite change. Negative for fever.  HENT: Positive for dental problem. Negative for congestion, drooling, mouth sores, trouble swallowing and voice change.   Respiratory: Negative.   Cardiovascular: Negative.   Gastrointestinal: Negative.   Genitourinary: Negative.   Musculoskeletal: Negative.   Skin: Negative.   Neurological: Negative.   Psychiatric/Behavioral: Negative.     Immunization History  Administered Date(s) Administered  .  Influenza-Unspecified 08/13/2016  . PPD Test 02/01/2017  . Pneumococcal-Unspecified 08/13/2016   Pertinent  Health Maintenance Due  Topic Date Due  . PNA vac Low Risk Adult (1 of 2 - PCV13) 08/22/1996  . INFLUENZA VACCINE  07/13/2017   No flowsheet data found. Functional Status Survey:    Vitals:   06/16/17 0812  BP: 112/60  Pulse: 80  Resp: 16  Temp: 98.1 F (36.7 C)  SpO2: 97%  Weight: 182 lb 14.4 oz (83 kg)  Height: 5\' 10"  (1.778 m)   Body mass index is 26.24 kg/m. Physical Exam  Constitutional: He is oriented to person, place, and time. Vital signs are normal. He appears well-developed and well-nourished. He is active and cooperative. He does not appear ill. No distress.  HENT:  Head: Normocephalic and atraumatic.  Mouth/Throat: Uvula is midline, oropharynx is clear and moist and mucous membranes are normal. Mucous membranes are not pale, not dry and not cyanotic. Abnormal dentition. Dental caries present.  Eyes: Pupils are equal, round, and reactive to light. Conjunctivae, EOM and lids are normal.  Neck: Trachea normal, normal range of motion and full passive range of motion without pain. Neck supple. No JVD present. No tracheal deviation, no edema and no erythema present. No thyromegaly present.  Cardiovascular: Normal rate, regular rhythm, normal heart sounds, intact distal pulses and normal pulses.  Exam reveals no gallop, no distant heart sounds and no friction rub.   No murmur heard. Pulses:      Dorsalis pedis pulses are 2+ on the right side, and 2+ on the left side.  Pulmonary/Chest: Effort normal and breath sounds normal. No accessory muscle usage. No respiratory distress. He has no decreased breath sounds. He has no wheezes. He has no rhonchi. He has no rales. He exhibits no tenderness.  Abdominal: Soft. Normal appearance and bowel sounds are normal. He exhibits no distension and no ascites. There is no tenderness.  Musculoskeletal: Normal range of motion. He  exhibits no edema or tenderness.  Expected osteoarthritis, stiffness  Neurological: He is alert and oriented to person, place, and time. He has normal strength. He displays no tremor. He displays no seizure activity.  Skin: Skin is warm, dry and intact. No rash noted. He is not diaphoretic. No cyanosis or erythema. No pallor. Nails show no clubbing.  Psychiatric: He has a normal mood and affect. His speech is normal and behavior is normal. Judgment and thought content normal. Cognition and memory are normal.  Nursing note and vitals reviewed.   Labs reviewed:  Recent Labs  11/01/16 0353 11/02/16 0403  02/14/17 0600 02/24/17 0450 03/23/17 1400  NA  --  138  < > 138 137 137  K  --  4.3  < > 4.1 4.0 4.0  CL  --  106  --  107 107 105  CO2  --  27  --  25 24 23   GLUCOSE  --  115*  --  96 95 111*  BUN  --  17  < > 12 17 16   CREATININE  --  1.04  < > 0.75 0.76 0.96  CALCIUM  --  9.1  --  8.6* 8.4* 8.8*  MG 1.9 2.0  --  1.9  --   --   PHOS 3.2 3.8  --   --   --   --   < > = values in this interval not displayed.  Recent Labs  02/14/17 0600 02/24/17 0450 03/23/17 1400  AST 19 16 39  ALT 21 22 32  ALKPHOS 67 61 78  BILITOT 0.8 0.5 0.9  PROT 6.1* 6.1* 6.6  ALBUMIN 3.1* 3.1* 3.4*    Recent Labs  02/14/17 0600 02/24/17 0450 03/23/17 1400  WBC 5.7 5.7 10.1  NEUTROABS 3.8 3.8 9.7*  HGB 14.1 14.0 12.8*  HCT 41.7 41.1 39.1*  MCV 81.6 82.0 84.5  PLT 142* 165 143*   Lab Results  Component Value Date   TSH 2.980 02/14/2017   No results found for: HGBA1C No results found for: CHOL, HDL, LDLCALC, LDLDIRECT, TRIG, CHOLHDL  Significant Diagnostic Results in last 30 days:  No results found.  Assessment/Plan 1. Tooth Pain  Urgent Dental referral   Acetaminophen 650 mg po QID  Tramadol 50 mg po Q 4 hours prn  Family/ staff Communication:   Total Time:  Documentation:  Face to Face:  Family/Phone:   Labs/tests ordered:    Medication list reviewed and assessed  for continued appropriateness.  Brynda Rim, NP-C Geriatrics [facility] [phone] [address], [place] [address] Cell Phone (Mon-Fri 8am-5pm):  [phone] On Call:  [phone] & follow prompts after 5pm & weekends Office Phone:  [phone] Office Fax:  [phone]

## 2017-06-23 ENCOUNTER — Non-Acute Institutional Stay (SKILLED_NURSING_FACILITY): Payer: Medicare Other | Admitting: Gerontology

## 2017-06-23 DIAGNOSIS — I10 Essential (primary) hypertension: Secondary | ICD-10-CM

## 2017-06-23 DIAGNOSIS — G301 Alzheimer's disease with late onset: Secondary | ICD-10-CM

## 2017-06-23 DIAGNOSIS — R918 Other nonspecific abnormal finding of lung field: Secondary | ICD-10-CM

## 2017-06-23 DIAGNOSIS — I2782 Chronic pulmonary embolism: Secondary | ICD-10-CM | POA: Diagnosis not present

## 2017-06-23 DIAGNOSIS — K0889 Other specified disorders of teeth and supporting structures: Secondary | ICD-10-CM | POA: Diagnosis not present

## 2017-06-23 DIAGNOSIS — M069 Rheumatoid arthritis, unspecified: Secondary | ICD-10-CM | POA: Diagnosis not present

## 2017-06-23 DIAGNOSIS — F329 Major depressive disorder, single episode, unspecified: Secondary | ICD-10-CM

## 2017-06-23 DIAGNOSIS — E538 Deficiency of other specified B group vitamins: Secondary | ICD-10-CM | POA: Diagnosis not present

## 2017-06-23 DIAGNOSIS — F0280 Dementia in other diseases classified elsewhere without behavioral disturbance: Secondary | ICD-10-CM

## 2017-06-24 NOTE — Progress Notes (Deleted)
Location:   [facility] Room Number: 308A Place of Service:  SNF [phone]) Provider:  Lorenso Quarry, NP-C    Patient Care Team: System, Pcp Not In as PCP - General  Extended Emergency Contact Information Primary Emergency Contact: Caviness,Carol Address: [address]          [place], [place] [address] [place] Home Phone: [phone] Relation: Other  Code Status: DNR Goals of care: Advanced Directive information Advanced Directives 06/23/2017  Does Patient Have a Medical Advance Directive? Yes  Type of Advance Directive Out of facility DNR (pink MOST or yellow form)  Does patient want to make changes to medical advance directive? No - Patient declined  Copy of Healthcare Power of Attorney in Chart? -  Pre-existing out of facility DNR order (yellow form or pink MOST form) -     Chief Complaint  Patient presents with  . Medical Management of Chronic Issues    Routine Visit    HPI:  Pt is a 83 y.o. male seen today for medical management of chronic diseases.     Past Medical History:  Diagnosis Date  . Alzheimer's dementia   . Dementia arising in the senium and presenium 02/22/2017  . HTN, goal below 150/90 02/22/2017   Losartan  . Hypertension   . Major depression in remission (HCC) 02/22/2017  . Pulmonary embolism without acute cor pulmonale (HCC) 02/22/2017  . RA (rheumatoid arthritis) (HCC)    Past Surgical History:  Procedure Laterality Date  . JOINT REPLACEMENT     left hip replacement 09-19-16    Allergies  Allergen Reactions  . Morphine Sulfate   . Penicillins Other (See Comments)    Family thinks pt is allergic to penicillin but not sure     Allergies as of 06/23/2017      Reactions   Morphine Sulfate    Penicillins Other (See Comments)   Family thinks pt is allergic to penicillin but not sure       Medication List       Accurate as of 06/23/17 11:59 PM. Always use your most recent med list.            acetaminophen 325 MG tablet Commonly known as:  TYLENOL Take 650 mg by mouth 4 (four) times daily. 8 am, 1 pm, 5 pm, 9 pm   apixaban 5 MG Tabs tablet Commonly known as:  ELIQUIS Take 1 tablet (5 mg total) by mouth 2 (two) times daily. Start after 10 mg tabs are finished   calcium-vitamin D 500-200 MG-UNIT tablet Commonly known as:  OSCAL WITH D Take 1 tablet by mouth 2 (two) times daily.   citalopram 10 MG tablet Commonly known as:  CELEXA Take 10 mg by mouth daily.   cyanocobalamin 1000 MCG tablet Take 1,000 mcg by mouth daily.   donepezil 10 MG tablet Commonly known as:  ARICEPT Take 10 mg by mouth daily.   folic acid 1 MG tablet Commonly known as:  FOLVITE Take 1 mg by mouth daily.   losartan 100 MG tablet Commonly known as:  COZAAR Take 100 mg by mouth daily.   methotrexate 2.5 MG tablet Commonly known as:  RHEUMATREX Take 2.5 mg by mouth once a week. Caution:Chemotherapy. Protect from light.  Pt takes on Tuesday evenings... Was changed by MD- per pts daughter   PREVIDENT 5000 DRY MOUTH 1.1 % Gel dental gel Generic drug:  sodium fluoride Please brush regularly with prescribed toothpaste only per Dr.  Eartha Inch DDS instruction on 7/10 during office visit. May keep in room   traMADol 50 MG tablet Commonly known as:  ULTRAM Take 50 mg by mouth every 4 (four) hours as needed.       Review of Systems  Immunization History  Administered Date(s) Administered  . Influenza-Unspecified 08/13/2016  . PPD Test 02/01/2017  . Pneumococcal-Unspecified 08/13/2016   Pertinent  Health Maintenance Due  Topic Date Due  . INFLUENZA VACCINE  07/13/2017  . PNA vac Low Risk Adult (2 of 2 - PCV13) 08/13/2017   No flowsheet data found. Functional Status Survey:    Vitals:   06/23/17 1158  BP: 126/66  Pulse: 78  Resp: 16  Temp: 98 F (36.7 C)  SpO2: 97%  Weight: 175 lb 12.8 oz (79.7 kg)  Height: 5\' 10"  (1.778 m)   Body mass index is 25.22 kg/m. Physical  Exam  Labs reviewed:  Recent Labs  11/01/16 0353 11/02/16 0403  02/14/17 0600 02/24/17 0450 03/23/17 1400  NA  --  138  < > 138 137 137  K  --  4.3  < > 4.1 4.0 4.0  CL  --  106  --  107 107 105  CO2  --  27  --  25 24 23   GLUCOSE  --  115*  --  96 95 111*  BUN  --  17  < > 12 17 16   CREATININE  --  1.04  < > 0.75 0.76 0.96  CALCIUM  --  9.1  --  8.6* 8.4* 8.8*  MG 1.9 2.0  --  1.9  --   --   PHOS 3.2 3.8  --   --   --   --   < > = values in this interval not displayed.  Recent Labs  02/14/17 0600 02/24/17 0450 03/23/17 1400  AST 19 16 39  ALT 21 22 32  ALKPHOS 67 61 78  BILITOT 0.8 0.5 0.9  PROT 6.1* 6.1* 6.6  ALBUMIN 3.1* 3.1* 3.4*    Recent Labs  02/14/17 0600 02/24/17 0450 03/23/17 1400  WBC 5.7 5.7 10.1  NEUTROABS 3.8 3.8 9.7*  HGB 14.1 14.0 12.8*  HCT 41.7 41.1 39.1*  MCV 81.6 82.0 84.5  PLT 142* 165 143*   Lab Results  Component Value Date   TSH 2.980 02/14/2017   No results found for: HGBA1C No results found for: CHOL, HDL, LDLCALC, LDLDIRECT, TRIG, CHOLHDL  Significant Diagnostic Results in last 30 days:  No results found.  Assessment/Plan There are no diagnoses linked to this encounter.   Family/ staff Communication: ***  Total Time:  Documentation:  Face to Face:  Family/Phone:   Labs/tests ordered:  ***  Medication list reviewed and assessed for continued appropriateness. Monthly medication orders reviewed and signed.  02/26/17, NP-C Geriatrics [facility] [phone] [address], [address] [address] Cell Phone (Mon-Fri 8am-5pm):  [phone] On Call:  [phone] & follow prompts after 5pm & weekends Office Phone:  [phone] Office Fax:  [phone]

## 2017-06-27 ENCOUNTER — Encounter: Payer: Self-pay | Admitting: Gerontology

## 2017-06-27 NOTE — Progress Notes (Signed)
Opened in error; Disregard.

## 2017-06-27 NOTE — Progress Notes (Signed)
Location:   [facility] Room Number: 308A Place of Service:  SNF [phone]) Provider:  Lorenso Quarry, NP-C  System, Pcp Not In  Patient Care Team: System, Pcp Not In as PCP - General  Extended Emergency Contact Information Primary Emergency Contact: Caviness,Carol Address: [address]          [place], [place] [address] [place] Home Phone: [phone] Relation: Other  Code Status:  DNR Goals of care: Advanced Directive information Advanced Directives 06/23/2017  Does Patient Have a Medical Advance Directive? Yes  Type of Advance Directive Out of facility DNR (pink MOST or yellow form)  Does patient want to make changes to medical advance directive? No - Patient declined  Copy of Healthcare Power of Attorney in Chart? -  Pre-existing out of facility DNR order (yellow form or pink MOST form) -     Chief Complaint  Patient presents with  . Medical Management of Chronic Issues    Routine Visit    HPI:  Pt is a 83 y.o. male seen today for medical management of chronic diseases. Pt has been stable this month. Had dead tooth extracted last week. Pt reports he is feeling much better since. No falls. Pt has been eating well. Having regular BMs. Pt enjoys socializing with other residents in the common areas. He is mobile on the unit in the wheelchair. No further issues with the "seizure-like"activity he experienced last month. Pt denies n/v/d/f/c/cp/sob/ha/abd pain/dizziness/cough. Denies pain. VSS. No other complaints.     Past Medical History:  Diagnosis Date  . Alzheimer's dementia   . Dementia arising in the senium and presenium 02/22/2017  . HTN, goal below 150/90 02/22/2017   Losartan  . Hypertension   . Major depression in remission (HCC) 02/22/2017  . Pulmonary embolism without acute cor pulmonale (HCC) 02/22/2017  . RA (rheumatoid arthritis) (HCC)    Past Surgical History:  Procedure Laterality Date  . JOINT REPLACEMENT     left hip  replacement 09-19-16    Allergies  Allergen Reactions  . Morphine Sulfate   . Penicillins Other (See Comments)    Family thinks pt is allergic to penicillin but not sure     Allergies as of 06/23/2017      Reactions   Morphine Sulfate    Penicillins Other (See Comments)   Family thinks pt is allergic to penicillin but not sure       Medication List       Accurate as of 06/23/17 11:59 PM. Always use your most recent med list.          acetaminophen 325 MG tablet Commonly known as:  TYLENOL Take 650 mg by mouth 4 (four) times daily. 8 am, 1 pm, 5 pm, 9 pm   apixaban 5 MG Tabs tablet Commonly known as:  ELIQUIS Take 1 tablet (5 mg total) by mouth 2 (two) times daily. Start after 10 mg tabs are finished   calcium-vitamin D 500-200 MG-UNIT tablet Commonly known as:  OSCAL WITH D Take 1 tablet by mouth 2 (two) times daily.   citalopram 10 MG tablet Commonly known as:  CELEXA Take 10 mg by mouth daily.   cyanocobalamin 1000 MCG tablet Take 1,000 mcg by mouth daily.   donepezil 10 MG tablet Commonly known as:  ARICEPT Take 10 mg by mouth daily.   folic acid 1 MG tablet Commonly known as:  FOLVITE Take 1 mg by mouth daily.   losartan 100 MG tablet  Commonly known as:  COZAAR Take 100 mg by mouth daily.   methotrexate 2.5 MG tablet Commonly known as:  RHEUMATREX Take 2.5 mg by mouth once a week. Caution:Chemotherapy. Protect from light.  Pt takes on Tuesday evenings... Was changed by MD- per pts daughter   PREVIDENT 5000 DRY MOUTH 1.1 % Gel dental gel Generic drug:  sodium fluoride Please brush regularly with prescribed toothpaste only per Dr. Eartha Inch DDS instruction on 7/10 during office visit. May keep in room   traMADol 50 MG tablet Commonly known as:  ULTRAM Take 50 mg by mouth every 4 (four) hours as needed.       Review of Systems  Constitutional: Negative for chills and diaphoresis.  HENT: Negative.   Respiratory: Negative for cough,  choking, chest tightness, shortness of breath and wheezing.   Cardiovascular: Negative for chest pain, palpitations and leg swelling.  Gastrointestinal: Negative for constipation, diarrhea and nausea.  Genitourinary: Negative for difficulty urinating, dysuria, frequency and urgency.  Musculoskeletal: Negative.  Arthralgias: typical arthritis.  Skin: Negative for color change, pallor, rash and wound.  Neurological: Negative for dizziness, tremors, seizures, syncope, speech difficulty, weakness, numbness and headaches.  Psychiatric/Behavioral: Negative for agitation and behavioral problems.  All other systems reviewed and are negative.   Immunization History  Administered Date(s) Administered  . Influenza-Unspecified 08/13/2016  . PPD Test 02/01/2017  . Pneumococcal-Unspecified 08/13/2016   Pertinent  Health Maintenance Due  Topic Date Due  . INFLUENZA VACCINE  07/13/2017  . PNA vac Low Risk Adult (2 of 2 - PCV13) 08/13/2017   No flowsheet data found. Functional Status Survey:    Vitals:   06/23/17 0826  BP: 126/66  Pulse: 78  Resp: 16  Temp: 98 F (36.7 C)  SpO2: 97%  Weight: 175 lb (79.4 kg)  Height: 5\' 10"  (1.778 m)   Body mass index is 25.11 kg/m. Physical Exam  Constitutional: He is oriented to person, place, and time. Vital signs are normal. He appears well-developed and well-nourished. He is active and cooperative. He does not appear ill. No distress.  HENT:  Head: Normocephalic and atraumatic.  Mouth/Throat: Uvula is midline, oropharynx is clear and moist and mucous membranes are normal. Mucous membranes are not pale, not dry and not cyanotic.  Eyes: Pupils are equal, round, and reactive to light. Conjunctivae, EOM and lids are normal.  Neck: Trachea normal, normal range of motion and full passive range of motion without pain. Neck supple. No JVD present. No tracheal deviation, no edema and no erythema present. No thyromegaly present.  Cardiovascular: Normal rate,  regular rhythm, normal heart sounds, intact distal pulses and normal pulses.  Exam reveals no gallop, no distant heart sounds and no friction rub.   No murmur heard. Pulses:      Dorsalis pedis pulses are 2+ on the right side, and 2+ on the left side.  Pulmonary/Chest: Effort normal and breath sounds normal. No accessory muscle usage. No respiratory distress. He has no decreased breath sounds. He has no wheezes. He has no rhonchi. He has no rales. He exhibits no tenderness.  Abdominal: Soft. Normal appearance and bowel sounds are normal. He exhibits no distension and no ascites. There is no tenderness.  Musculoskeletal: Normal range of motion. He exhibits no edema or tenderness.  Expected osteoarthritis, stiffness  Neurological: He is alert and oriented to person, place, and time. He has normal strength. He displays no tremor. He displays no seizure activity.  Skin: Skin is warm and intact. No  rash noted. He is not diaphoretic. No cyanosis or erythema. No pallor. Nails show no clubbing.  Psychiatric: He has a normal mood and affect. His speech is normal and behavior is normal. Judgment and thought content normal. Cognition and memory are normal.  Nursing note and vitals reviewed.   Labs reviewed:  Recent Labs  11/01/16 0353 11/02/16 0403  02/14/17 0600 02/24/17 0450 03/23/17 1400  NA  --  138  < > 138 137 137  K  --  4.3  < > 4.1 4.0 4.0  CL  --  106  --  107 107 105  CO2  --  27  --  25 24 23   GLUCOSE  --  115*  --  96 95 111*  BUN  --  17  < > 12 17 16   CREATININE  --  1.04  < > 0.75 0.76 0.96  CALCIUM  --  9.1  --  8.6* 8.4* 8.8*  MG 1.9 2.0  --  1.9  --   --   PHOS 3.2 3.8  --   --   --   --   < > = values in this interval not displayed.  Recent Labs  02/14/17 0600 02/24/17 0450 03/23/17 1400  AST 19 16 39  ALT 21 22 32  ALKPHOS 67 61 78  BILITOT 0.8 0.5 0.9  PROT 6.1* 6.1* 6.6  ALBUMIN 3.1* 3.1* 3.4*    Recent Labs  02/14/17 0600 02/24/17 0450 03/23/17 1400    WBC 5.7 5.7 10.1  NEUTROABS 3.8 3.8 9.7*  HGB 14.1 14.0 12.8*  HCT 41.7 41.1 39.1*  MCV 81.6 82.0 84.5  PLT 142* 165 143*   Lab Results  Component Value Date   TSH 2.980 02/14/2017   No results found for: HGBA1C No results found for: CHOL, HDL, LDLCALC, LDLDIRECT, TRIG, CHOLHDL  Significant Diagnostic Results in last 30 days:  No results found.  Assessment/Plan 1. Late onset Alzheimer's disease without behavioral disturbance  Stable   Continue donepezil tablet; 10 mg po Q HS  2. Dementia associated with other underlying disease without behavioral disturbance  Stable  3. Major depressive disorder with single episode, remission status unspecified  Stable  Continue citalopram tablet; 10 mg; 1 tablet po Q Day  4. Chronic pulmonary thromboembolism syndrome (HCC)  Stable  Continue Eliquis (apixaban) tablet; 5 mg po BID  5. Other nonspecific abnormal finding of lung field  Stable  6. Rheumatoid arthritis involving both hands, unspecified rheumatoid factor presence (HCC)  Stable  Continue calcium carbonate-vitamin D3 [OTC] tablet; 500 mg(1,250mg ) -200 unit; amt: 1 tablet PO BID  Continue folic acid [OTC] tablet; 1 mg PO Q Day  Continue methotrexate sodium tablet; 2.5 mg po Q Week on Tuesdays  7. Essential hypertension  Stable  Continue losartan tablet; 100 mg; 1 tablet po Q Day  8. Vitamin B12 deficiency  Stable  Continue cyanocobalamin (vitamin B-12) [OTC] tablet; 1,000 mcg 1 tablet po Q Day  9. Tooth pain  Resolved  Tooth extracted by dentist  Family/ staff Communication:   Total Time:  Documentation:  Face to Face:  Family/Phone:   Labs/tests ordered:    Medication list reviewed and assessed for continued appropriateness. Monthly medication orders reviewed and signed.  Brynda Rim, NP-C Geriatrics [facility] [phone] [address], [place] [address] Cell Phone (Mon-Fri 8am-5pm):   [phone] On Call:  [phone] & follow prompts after 5pm & weekends Office Phone:  [phone] Office  Fax:  [phone]

## 2017-07-13 ENCOUNTER — Encounter
Admission: RE | Admit: 2017-07-13 | Discharge: 2017-07-13 | Disposition: A | Discharge status: Home / Self Care | Payer: Medicare Other | Source: Ambulatory Visit | Attending: Internal Medicine | Admitting: Internal Medicine

## 2017-07-25 ENCOUNTER — Non-Acute Institutional Stay (SKILLED_NURSING_FACILITY): Payer: Medicare Other | Admitting: Gerontology

## 2017-07-25 ENCOUNTER — Encounter: Payer: Self-pay | Admitting: Gerontology

## 2017-07-25 DIAGNOSIS — M069 Rheumatoid arthritis, unspecified: Secondary | ICD-10-CM

## 2017-07-25 DIAGNOSIS — R918 Other nonspecific abnormal finding of lung field: Secondary | ICD-10-CM

## 2017-07-25 DIAGNOSIS — I10 Essential (primary) hypertension: Secondary | ICD-10-CM | POA: Diagnosis not present

## 2017-07-25 DIAGNOSIS — F0280 Dementia in other diseases classified elsewhere without behavioral disturbance: Secondary | ICD-10-CM | POA: Diagnosis not present

## 2017-07-25 DIAGNOSIS — F329 Major depressive disorder, single episode, unspecified: Secondary | ICD-10-CM | POA: Diagnosis not present

## 2017-07-25 DIAGNOSIS — E538 Deficiency of other specified B group vitamins: Secondary | ICD-10-CM | POA: Diagnosis not present

## 2017-07-25 DIAGNOSIS — I2782 Chronic pulmonary embolism: Secondary | ICD-10-CM

## 2017-07-25 DIAGNOSIS — G301 Alzheimer's disease with late onset: Secondary | ICD-10-CM

## 2017-07-25 NOTE — Progress Notes (Signed)
Location:   [facility] Room Number: 308A Place of Service:  SNF [phone]) Provider:  Lorenso Quarry, NP-C  System, Pcp Not In  Patient Care Team: System, Pcp Not In as PCP - General  Extended Emergency Contact Information Primary Emergency Contact: Caviness,Carol Address: [address]          [place], [place] [address] [place] Home Phone: [phone] Relation: Other  Code Status:  DNR Goals of care: Advanced Directive information Advanced Directives 07/25/2017  Does Patient Have a Medical Advance Directive? Yes  Type of Advance Directive Out of facility DNR (pink MOST or yellow form)  Does patient want to make changes to medical advance directive? No - Patient declined  Copy of Healthcare Power of Attorney in Chart? -  Pre-existing out of facility DNR order (yellow form or pink MOST form) -     Chief Complaint  Patient presents with  . Medical Management of Chronic Issues    Routine Visit    HPI:  Pt is a 83 y.o. male seen today for medical management of chronic diseases. Pt has been stable this month. Had dead tooth extracted last month. Pt reports he is feeling much better since. No falls. Pt has been eating well. Having regular BMs. Pt enjoys socializing with other residents in the common areas. He is mobile on the unit in the wheelchair. No further issues with the "seizure-like"activity he experienced several months ago. Pt denies n/v/d/f/c/cp/sob/ha/abd pain/dizziness/cough. Denies pain. VSS. No other complaints.     Past Medical History:  Diagnosis Date  . Alzheimer's dementia   . Dementia arising in the senium and presenium 02/22/2017  . HTN, goal below 150/90 02/22/2017   Losartan  . Hypertension   . Major depression in remission (HCC) 02/22/2017  . Pulmonary embolism without acute cor pulmonale (HCC) 02/22/2017  . RA (rheumatoid arthritis) (HCC)    Past Surgical History:  Procedure Laterality Date  . JOINT REPLACEMENT     left hip replacement 09-19-16    Allergies  Allergen Reactions  . Morphine Sulfate   . Penicillins Other (See Comments)    Family thinks pt is allergic to penicillin but not sure     Allergies as of 07/25/2017      Reactions   Morphine Sulfate    Penicillins Other (See Comments)   Family thinks pt is allergic to penicillin but not sure       Medication List       Accurate as of 07/25/17 11:16 AM. Always use your most recent med list.          acetaminophen 325 MG tablet Commonly known as:  TYLENOL Take 650 mg by mouth 4 (four) times daily. 8 am, 1 pm, 5 pm, 9 pm   apixaban 5 MG Tabs tablet Commonly known as:  ELIQUIS Take 1 tablet (5 mg total) by mouth 2 (two) times daily. Start after 10 mg tabs are finished   calcium-vitamin D 500-200 MG-UNIT tablet Commonly known as:  OSCAL WITH D Take 1 tablet by mouth 2 (two) times daily.   citalopram 10 MG tablet Commonly known as:  CELEXA Take 10 mg by mouth daily.   cyanocobalamin 1000 MCG tablet Take 1,000 mcg by mouth daily.   donepezil 10 MG tablet Commonly known as:  ARICEPT Take 10 mg by mouth daily.   folic acid 1 MG tablet Commonly known as:  FOLVITE Take 1 mg by mouth daily.   losartan 100 MG tablet  Commonly known as:  COZAAR Take 100 mg by mouth daily.   methotrexate 2.5 MG tablet Commonly known as:  RHEUMATREX Take 2.5 mg by mouth once a week. Caution:Chemotherapy. Protect from light.  Pt takes on Tuesday evenings... Was changed by MD- per pts daughter   PREVIDENT 5000 DRY MOUTH 1.1 % Gel dental gel Generic drug:  sodium fluoride Please brush regularly with prescribed toothpaste only per Dr. Eartha Inch DDS instruction on 7/10 during office visit. May keep in room   traMADol 50 MG tablet Commonly known as:  ULTRAM Take 50 mg by mouth every 4 (four) hours as needed.       Review of Systems  Constitutional: Negative for chills and diaphoresis.  HENT: Negative.   Respiratory: Negative for  cough, choking, chest tightness, shortness of breath and wheezing.   Cardiovascular: Negative for chest pain, palpitations and leg swelling.  Gastrointestinal: Negative for constipation, diarrhea and nausea.  Genitourinary: Negative for difficulty urinating, dysuria, frequency and urgency.  Musculoskeletal: Negative.  Arthralgias: typical arthritis.  Skin: Negative for color change, pallor, rash and wound.  Neurological: Negative for dizziness, tremors, seizures, syncope, speech difficulty, weakness, numbness and headaches.  Psychiatric/Behavioral: Negative for agitation and behavioral problems.  All other systems reviewed and are negative.   Immunization History  Administered Date(s) Administered  . Influenza-Unspecified 08/13/2016  . PPD Test 02/01/2017  . Pneumococcal-Unspecified 08/13/2016   Pertinent  Health Maintenance Due  Topic Date Due  . INFLUENZA VACCINE  07/13/2017  . PNA vac Low Risk Adult (2 of 2 - PCV13) 08/13/2017   No flowsheet data found. Functional Status Survey:    Vitals:   07/25/17 1054  BP: 128/74  Pulse: 84  Resp: 18  Temp: 98.8 F (37.1 C)  SpO2: 94%  Weight: 183 lb (83 kg)  Height: 5\' 10"  (1.778 m)   Body mass index is 26.26 kg/m. Physical Exam  Constitutional: He is oriented to person, place, and time. Vital signs are normal. He appears well-developed and well-nourished. He is active and cooperative. He does not appear ill. No distress.  HENT:  Head: Normocephalic and atraumatic.  Mouth/Throat: Uvula is midline, oropharynx is clear and moist and mucous membranes are normal. Mucous membranes are not pale, not dry and not cyanotic.  Eyes: Pupils are equal, round, and reactive to light. Conjunctivae, EOM and lids are normal.  Neck: Trachea normal, normal range of motion and full passive range of motion without pain. Neck supple. No JVD present. No tracheal deviation, no edema and no erythema present. No thyromegaly present.  Cardiovascular: Normal  rate, regular rhythm, normal heart sounds, intact distal pulses and normal pulses.  Exam reveals no gallop, no distant heart sounds and no friction rub.   No murmur heard. Pulses:      Dorsalis pedis pulses are 2+ on the right side, and 2+ on the left side.  Pulmonary/Chest: Effort normal and breath sounds normal. No accessory muscle usage. No respiratory distress. He has no decreased breath sounds. He has no wheezes. He has no rhonchi. He has no rales. He exhibits no tenderness.  Abdominal: Soft. Normal appearance and bowel sounds are normal. He exhibits no distension and no ascites. There is no tenderness.  Musculoskeletal: Normal range of motion. He exhibits no edema or tenderness.  Expected osteoarthritis, stiffness  Neurological: He is alert and oriented to person, place, and time. He has normal strength. He displays no tremor. He displays no seizure activity.  Skin: Skin is warm and intact. No  rash noted. He is not diaphoretic. No cyanosis or erythema. No pallor. Nails show no clubbing.  Psychiatric: He has a normal mood and affect. His speech is normal and behavior is normal. Judgment and thought content normal. Cognition and memory are normal.  Nursing note and vitals reviewed.   Labs reviewed:  Recent Labs  11/01/16 0353 11/02/16 0403  02/14/17 0600 02/24/17 0450 03/23/17 1400  NA  --  138  < > 138 137 137  K  --  4.3  < > 4.1 4.0 4.0  CL  --  106  --  107 107 105  CO2  --  27  --  25 24 23   GLUCOSE  --  115*  --  96 95 111*  BUN  --  17  < > 12 17 16   CREATININE  --  1.04  < > 0.75 0.76 0.96  CALCIUM  --  9.1  --  8.6* 8.4* 8.8*  MG 1.9 2.0  --  1.9  --   --   PHOS 3.2 3.8  --   --   --   --   < > = values in this interval not displayed.  Recent Labs  02/14/17 0600 02/24/17 0450 03/23/17 1400  AST 19 16 39  ALT 21 22 32  ALKPHOS 67 61 78  BILITOT 0.8 0.5 0.9  PROT 6.1* 6.1* 6.6  ALBUMIN 3.1* 3.1* 3.4*    Recent Labs  02/14/17 0600 02/24/17 0450  03/23/17 1400  WBC 5.7 5.7 10.1  NEUTROABS 3.8 3.8 9.7*  HGB 14.1 14.0 12.8*  HCT 41.7 41.1 39.1*  MCV 81.6 82.0 84.5  PLT 142* 165 143*   Lab Results  Component Value Date   TSH 2.980 02/14/2017   No results found for: HGBA1C No results found for: CHOL, HDL, LDLCALC, LDLDIRECT, TRIG, CHOLHDL  Significant Diagnostic Results in last 30 days:  No results found.  Assessment/Plan 1. Late onset Alzheimer's disease without behavioral disturbance  Stable   Continue donepezil tablet; 10 mg po Q HS  2. Dementia associated with other underlying disease without behavioral disturbance  Stable  3. Major depressive disorder with single episode, remission status unspecified  Stable  Continue citalopram tablet; 10 mg; 1 tablet po Q Day  4. Chronic pulmonary thromboembolism syndrome (HCC)  Stable  Continue Eliquis (apixaban) tablet; 5 mg po BID  5. Other nonspecific abnormal finding of lung field  Stable  6. Rheumatoid arthritis involving both hands, unspecified rheumatoid factor presence (HCC)  Stable  Continue calcium carbonate-vitamin D3 [OTC] tablet; 500 mg(1,250mg ) -200 unit; amt: 1 tablet PO BID  Continue folic acid [OTC] tablet; 1 mg PO Q Day  Continue methotrexate sodium tablet; 2.5 mg po Q Week on Tuesdays  7. Essential hypertension  Stable  Continue losartan tablet; 100 mg; 1 tablet po Q Day  8. Vitamin B12 deficiency  Stable  Continue cyanocobalamin (vitamin B-12) [OTC] tablet; 1,000 mcg 1 tablet po Q Day  Family/ staff Communication:   Total Time:  Documentation:  Face to Face:  Family/Phone:   Labs/tests ordered:    Medication list reviewed and assessed for continued appropriateness. Monthly medication orders reviewed and signed.  04/16/2017, NP-C Geriatrics [facility] [phone] [address], [address] [place] Cell Phone (Mon-Fri 8am-5pm):  [phone] On Call:  [phone] & follow prompts  after 5pm & weekends Office Phone:  [phone] Office Fax:  [phone]

## 2017-08-13 ENCOUNTER — Encounter
Admission: RE | Admit: 2017-08-13 | Discharge: 2017-08-13 | Disposition: A | Discharge status: Home / Self Care | Payer: Medicare Other | Source: Ambulatory Visit | Attending: Internal Medicine | Admitting: Internal Medicine

## 2017-08-22 ENCOUNTER — Encounter: Payer: Self-pay | Admitting: Podiatry

## 2017-08-22 ENCOUNTER — Ambulatory Visit (INDEPENDENT_AMBULATORY_CARE_PROVIDER_SITE_OTHER): Payer: Medicare Other | Admitting: Podiatry

## 2017-08-22 DIAGNOSIS — B351 Tinea unguium: Secondary | ICD-10-CM | POA: Diagnosis not present

## 2017-08-22 DIAGNOSIS — M79676 Pain in unspecified toe(s): Secondary | ICD-10-CM

## 2017-08-22 NOTE — Progress Notes (Signed)
Complaint:  Visit Type: Patient returns to my office for continued preventative foot care services. Complaint: Patient states" my nails have grown long and thick and become painful to walk and wear shoes" . The patient presents for preventative foot care services. No changes to ROS  Podiatric Exam: Vascular: dorsalis pedis and posterior tibial pulses are palpable bilateral. Capillary return is immediate. Temperature gradient is WNL. Skin turgor WNL  Sensorium: Normal Semmes Weinstein monofilament test. Normal tactile sensation bilaterally. Nail Exam: Pt has thick disfigured discolored nails with subungual debris noted bilateral entire nail hallux  Ulcer Exam: There is no evidence of ulcer or pre-ulcerative changes or infection. Orthopedic Exam: Muscle tone and strength are WNL. No limitations in general ROM. No crepitus or effusions noted. Foot type and digits show no abnormalities. Bony prominences are unremarkable. Skin: No Porokeratosis. No infection or ulcers  Diagnosis:  Onychomycosis, , Pain in right toe, pain in left toes  Treatment & Plan Procedures and Treatment: Consent by patient was obtained for treatment procedures.   Debridement of mycotic and hypertrophic toenails, 1 through 5 bilateral and clearing of subungual debris. No ulceration, no infection noted.  Return Visit-Office Procedure: Patient instructed to return to the office for a follow up visit 3 months for continued evaluation and treatment.    Jackqulyn Mendel DPM 

## 2017-08-25 ENCOUNTER — Encounter: Payer: Self-pay | Admitting: Gerontology

## 2017-08-25 ENCOUNTER — Non-Acute Institutional Stay (SKILLED_NURSING_FACILITY): Payer: Medicare Other | Admitting: Gerontology

## 2017-08-25 DIAGNOSIS — F0280 Dementia in other diseases classified elsewhere without behavioral disturbance: Secondary | ICD-10-CM | POA: Diagnosis not present

## 2017-08-25 DIAGNOSIS — F329 Major depressive disorder, single episode, unspecified: Secondary | ICD-10-CM | POA: Diagnosis not present

## 2017-08-25 DIAGNOSIS — R918 Other nonspecific abnormal finding of lung field: Secondary | ICD-10-CM | POA: Diagnosis not present

## 2017-08-25 DIAGNOSIS — G301 Alzheimer's disease with late onset: Secondary | ICD-10-CM

## 2017-08-25 DIAGNOSIS — M069 Rheumatoid arthritis, unspecified: Secondary | ICD-10-CM | POA: Diagnosis not present

## 2017-08-25 DIAGNOSIS — I2782 Chronic pulmonary embolism: Secondary | ICD-10-CM | POA: Diagnosis not present

## 2017-08-25 DIAGNOSIS — E538 Deficiency of other specified B group vitamins: Secondary | ICD-10-CM

## 2017-08-25 DIAGNOSIS — I10 Essential (primary) hypertension: Secondary | ICD-10-CM | POA: Diagnosis not present

## 2017-08-25 NOTE — Progress Notes (Signed)
Location:   [facility] Room Number: 308A Place of Service:  SNF [phone]) Provider:  Lorenso Quarry, NP-C  System, Pcp Not In  Patient Care Team: System, Pcp Not In as PCP - General  Extended Emergency Contact Information Primary Emergency Contact: Caviness,Carol Address: [address]          [place], [place] [address] [place] Home Phone: [phone] Relation: Other  Code Status:  DNR Goals of care: Advanced Directive information Advanced Directives 08/25/2017  Does Patient Have a Medical Advance Directive? Yes  Type of Advance Directive Out of facility DNR (pink MOST or yellow form)  Does patient want to make changes to medical advance directive? No - Patient declined  Copy of Healthcare Power of Attorney in Chart? -  Pre-existing out of facility DNR order (yellow form or pink MOST form) -     Chief Complaint  Patient presents with  . Medical Management of Chronic Issues    Routine Visit    HPI:  Pt is a 83 y.o. male seen today for medical management of chronic diseases. Pt has been stable this month. No falls. Pt has been eating well. Having regular BMs. Pt enjoys socializing with other residents in the common areas. He is mobile on the unit in the wheelchair. No further issues with the "seizure-like" activity he experienced several months ago. Pt denies n/v/d/f/c/cp/sob/ha/abd pain/dizziness/cough. Denies pain. VSS. No other complaints.     Past Medical History:  Diagnosis Date  . Alzheimer's dementia   . Dementia arising in the senium and presenium 02/22/2017  . HTN, goal below 150/90 02/22/2017   Losartan  . Hypertension   . Major depression in remission (HCC) 02/22/2017  . Pulmonary embolism without acute cor pulmonale (HCC) 02/22/2017  . RA (rheumatoid arthritis) (HCC)    Past Surgical History:  Procedure Laterality Date  . JOINT REPLACEMENT     left hip replacement 09-19-16    Allergies  Allergen Reactions  . Morphine  Sulfate   . Penicillins Other (See Comments)    Family thinks pt is allergic to penicillin but not sure     Allergies as of 08/25/2017      Reactions   Morphine Sulfate    Penicillins Other (See Comments)   Family thinks pt is allergic to penicillin but not sure       Medication List       Accurate as of 08/25/17 11:44 AM. Always use your most recent med list.          acetaminophen 325 MG tablet Commonly known as:  TYLENOL Take 650 mg by mouth 4 (four) times daily. 8 am, 1 pm, 5 pm, 9 pm   apixaban 5 MG Tabs tablet Commonly known as:  ELIQUIS Take 1 tablet (5 mg total) by mouth 2 (two) times daily. Start after 10 mg tabs are finished   calcium-vitamin D 500-200 MG-UNIT tablet Commonly known as:  OSCAL WITH D Take 1 tablet by mouth 2 (two) times daily.   citalopram 10 MG tablet Commonly known as:  CELEXA Take 10 mg by mouth daily.   cyanocobalamin 1000 MCG tablet Take 1,000 mcg by mouth daily.   donepezil 10 MG tablet Commonly known as:  ARICEPT Take 10 mg by mouth daily.   folic acid 1 MG tablet Commonly known as:  FOLVITE Take 1 mg by mouth daily.   losartan 100 MG tablet Commonly known as:  COZAAR Take 100 mg by mouth daily.  methotrexate 2.5 MG tablet Commonly known as:  RHEUMATREX Take 2.5 mg by mouth once a week. Caution:Chemotherapy. Protect from light.  Pt takes on Tuesday evenings... Was changed by MD- per pts daughter   PREVIDENT 5000 DRY MOUTH 1.1 % Gel dental gel Generic drug:  sodium fluoride Please brush regularly with prescribed toothpaste only per Dr. Eartha Inch DDS instruction on 7/10 during office visit. May keep in room   traMADol 50 MG tablet Commonly known as:  ULTRAM Take 50 mg by mouth every 4 (four) hours as needed.       Review of Systems  Constitutional: Negative for chills and diaphoresis.  HENT: Negative.   Respiratory: Negative for cough, choking, chest tightness, shortness of breath and wheezing.     Cardiovascular: Negative for chest pain, palpitations and leg swelling.  Gastrointestinal: Negative for constipation, diarrhea and nausea.  Genitourinary: Negative for difficulty urinating, dysuria, frequency and urgency.  Musculoskeletal: Negative.  Arthralgias: typical arthritis.  Skin: Negative for color change, pallor, rash and wound.  Neurological: Negative for dizziness, tremors, seizures, syncope, speech difficulty, weakness, numbness and headaches.  Psychiatric/Behavioral: Negative for agitation and behavioral problems.  All other systems reviewed and are negative.   Immunization History  Administered Date(s) Administered  . Influenza-Unspecified 08/13/2016  . PPD Test 02/01/2017  . Pneumococcal-Unspecified 08/13/2016   Pertinent  Health Maintenance Due  Topic Date Due  . INFLUENZA VACCINE  07/13/2017  . PNA vac Low Risk Adult (2 of 2 - PCV13) 08/13/2017   No flowsheet data found. Functional Status Survey:    Vitals:   08/25/17 1137  BP: (!) 103/55  Pulse: 60  Resp: 16  Temp: 98.7 F (37.1 C)  SpO2: 98%  Weight: 181 lb 12.8 oz (82.5 kg)  Height: 5\' 10"  (1.778 m)   Body mass index is 26.09 kg/m. Physical Exam  Constitutional: He is oriented to person, place, and time. Vital signs are normal. He appears well-developed and well-nourished. He is active and cooperative. He does not appear ill. No distress.  HENT:  Head: Normocephalic and atraumatic.  Mouth/Throat: Uvula is midline, oropharynx is clear and moist and mucous membranes are normal. Mucous membranes are not pale, not dry and not cyanotic.  Eyes: Pupils are equal, round, and reactive to light. Conjunctivae, EOM and lids are normal.  Neck: Trachea normal, normal range of motion and full passive range of motion without pain. Neck supple. No JVD present. No tracheal deviation, no edema and no erythema present. No thyromegaly present.  Cardiovascular: Normal rate, regular rhythm, normal heart sounds, intact  distal pulses and normal pulses.  Exam reveals no gallop, no distant heart sounds and no friction rub.   No murmur heard. Pulses:      Dorsalis pedis pulses are 2+ on the right side, and 2+ on the left side.  Pulmonary/Chest: Effort normal and breath sounds normal. No accessory muscle usage. No respiratory distress. He has no decreased breath sounds. He has no wheezes. He has no rhonchi. He has no rales. He exhibits no tenderness.  Abdominal: Soft. Normal appearance and bowel sounds are normal. He exhibits no distension and no ascites. There is no tenderness.  Musculoskeletal: Normal range of motion. He exhibits no edema or tenderness.  Expected osteoarthritis, stiffness  Neurological: He is alert and oriented to person, place, and time. He has normal strength. He displays no tremor. He displays no seizure activity.  Skin: Skin is warm and intact. No rash noted. He is not diaphoretic. No cyanosis or  erythema. No pallor. Nails show no clubbing.  Psychiatric: He has a normal mood and affect. His speech is normal and behavior is normal. Judgment and thought content normal. Cognition and memory are normal.  Nursing note and vitals reviewed.   Labs reviewed:  Recent Labs  11/01/16 0353 11/02/16 0403  02/14/17 0600 02/24/17 0450 03/23/17 1400  NA  --  138  < > 138 137 137  K  --  4.3  < > 4.1 4.0 4.0  CL  --  106  --  107 107 105  CO2  --  27  --  25 24 23   GLUCOSE  --  115*  --  96 95 111*  BUN  --  17  < > 12 17 16   CREATININE  --  1.04  < > 0.75 0.76 0.96  CALCIUM  --  9.1  --  8.6* 8.4* 8.8*  MG 1.9 2.0  --  1.9  --   --   PHOS 3.2 3.8  --   --   --   --   < > = values in this interval not displayed.  Recent Labs  02/14/17 0600 02/24/17 0450 03/23/17 1400  AST 19 16 39  ALT 21 22 32  ALKPHOS 67 61 78  BILITOT 0.8 0.5 0.9  PROT 6.1* 6.1* 6.6  ALBUMIN 3.1* 3.1* 3.4*    Recent Labs  02/14/17 0600 02/24/17 0450 03/23/17 1400  WBC 5.7 5.7 10.1  NEUTROABS 3.8 3.8 9.7*    HGB 14.1 14.0 12.8*  HCT 41.7 41.1 39.1*  MCV 81.6 82.0 84.5  PLT 142* 165 143*   Lab Results  Component Value Date   TSH 2.980 02/14/2017   No results found for: HGBA1C No results found for: CHOL, HDL, LDLCALC, LDLDIRECT, TRIG, CHOLHDL  Significant Diagnostic Results in last 30 days:  No results found.  Assessment/Plan 1. Late onset Alzheimer's disease without behavioral disturbance  Stable   Continue donepezil tablet; 10 mg po Q HS  2. Dementia associated with other underlying disease without behavioral disturbance  Stable  3. Major depressive disorder with single episode, remission status unspecified  Stable  Continue citalopram tablet; 10 mg; 1 tablet po Q Day  4. Chronic pulmonary thromboembolism syndrome (HCC)  Stable  Continue Eliquis (apixaban) tablet; 5 mg po BID  5. Other nonspecific abnormal finding of lung field  Stable  6. Rheumatoid arthritis involving both hands, unspecified rheumatoid factor presence (HCC)  Stable  Continue calcium carbonate-vitamin D3 [OTC] tablet; 500 mg(1,250mg ) -200 unit; amt: 1 tablet PO BID  Continue folic acid [OTC] tablet; 1 mg PO Q Day  Continue methotrexate sodium tablet; 2.5 mg po Q Week on Tuesdays  7. Essential hypertension  Stable  Continue losartan tablet; 50 mg; 1 tablet po Q Day  8. Vitamin B12 deficiency  Stable  Continue cyanocobalamin (vitamin B-12) [OTC] tablet; 1,000 mcg 1 tablet po Q Day  Family/ staff Communication:   Total Time:  Documentation:  Face to Face:  Family/Phone:   Labs/tests ordered:  Not due  Medication list reviewed and assessed for continued appropriateness. Monthly medication orders reviewed and signed.  04/16/2017, NP-C Geriatrics [facility] [phone] [address], [address] [place] Cell Phone (Mon-Fri 8am-5pm):  [phone] On Call:  [phone] & follow prompts after 5pm & weekends Office Phone:   [phone] Office Fax:  [phone]

## 2017-09-12 ENCOUNTER — Encounter
Admission: RE | Admit: 2017-09-12 | Discharge: 2017-09-12 | Disposition: A | Discharge status: Home / Self Care | Payer: Medicare Other | Source: Ambulatory Visit | Attending: Internal Medicine | Admitting: Internal Medicine

## 2017-10-04 ENCOUNTER — Non-Acute Institutional Stay (SKILLED_NURSING_FACILITY): Payer: Medicare Other | Admitting: Gerontology

## 2017-10-04 ENCOUNTER — Encounter: Payer: Self-pay | Admitting: Gerontology

## 2017-10-04 DIAGNOSIS — M069 Rheumatoid arthritis, unspecified: Secondary | ICD-10-CM | POA: Diagnosis not present

## 2017-10-04 DIAGNOSIS — I10 Essential (primary) hypertension: Secondary | ICD-10-CM | POA: Diagnosis not present

## 2017-10-04 DIAGNOSIS — R918 Other nonspecific abnormal finding of lung field: Secondary | ICD-10-CM

## 2017-10-04 DIAGNOSIS — E538 Deficiency of other specified B group vitamins: Secondary | ICD-10-CM

## 2017-10-04 DIAGNOSIS — G301 Alzheimer's disease with late onset: Secondary | ICD-10-CM

## 2017-10-04 DIAGNOSIS — F0280 Dementia in other diseases classified elsewhere without behavioral disturbance: Secondary | ICD-10-CM

## 2017-10-04 DIAGNOSIS — F329 Major depressive disorder, single episode, unspecified: Secondary | ICD-10-CM | POA: Diagnosis not present

## 2017-10-04 DIAGNOSIS — I2782 Chronic pulmonary embolism: Secondary | ICD-10-CM

## 2017-10-12 NOTE — Progress Notes (Signed)
Location:   [facility] Room Number: [place] of Service:  SNF [phone]) Provider:  Toni Arthurs, NP-C  System, Pcp Not In  Patient Care Team: System, Pcp Not In as PCP - General  Extended Emergency Contact Information Primary Emergency Contact: Caviness,Carol Address: [address]          [place], [place] [address] [place] Phone: [phone] Relation: Other  Code Status:  DNR Goals of care: Advanced Directive information Advanced Directives 10/04/2017  Does Patient Have a Medical Advance Directive? Yes  Type of Advance Directive Out of facility DNR (pink MOST or yellow form)  Does patient want to make changes to medical advance directive? No - Patient declined  Copy of [place] in Chart? -  Pre-existing out of facility DNR order (yellow form or pink MOST form) -     Chief Complaint  Patient presents with  . Medical Management of Chronic Issues    Routine Visit    HPI:  Pt is a 83 y.o. male seen today for medical management of chronic diseases.  Patient has been stable this month.  No falls.  Patient has been eating well.  Having regular BMs.  Patient enjoys socializing with other residents in the common areas.  He is mobile on the unit in a wheelchair.  No further issues with the "seizure-like "activity he experienced several months ago.  Patient denies n/v/d/f/c/cp/sob/ha/abd pain/dizziness/cough/congestion.  Denies pain.  Vital signs stable.  No other complaints.  Past Medical History:  Diagnosis Date  . Alzheimer's dementia   . Dementia arising in the senium and presenium 02/22/2017  . HTN, goal below 150/90 02/22/2017   Losartan  . Hypertension   . Major depression in remission ([place]) 02/22/2017  . Pulmonary embolism without acute cor pulmonale ([place]) 02/22/2017  . RA (rheumatoid arthritis) ([place])    Past Surgical History:  Procedure Laterality Date  . JOINT REPLACEMENT     left hip replacement 09-19-16     Allergies  Allergen Reactions  . Morphine Sulfate   . Penicillins Other (See Comments)    Family thinks pt is allergic to penicillin but not sure     Allergies as of 10/04/2017      Reactions   Morphine Sulfate    Penicillins Other (See Comments)   Family thinks pt is allergic to penicillin but not sure       Medication List       Accurate as of 10/04/17 11:59 PM. Always use your most recent med list.          acetaminophen 325 MG tablet Commonly known as:  TYLENOL Take 650 mg by mouth 4 (four) times daily. 8 am, 1 pm, 5 pm, 9 pm   apixaban 5 MG Tabs tablet Commonly known as:  ELIQUIS Take 1 tablet (5 mg total) by mouth 2 (two) times daily. Start after 10 mg tabs are finished   calcium-vitamin D 500-200 MG-UNIT tablet Commonly known as:  OSCAL WITH D Take 1 tablet by mouth 2 (two) times daily.   citalopram 10 MG tablet Commonly known as:  CELEXA Take 10 mg by mouth daily.   cyanocobalamin 1000 MCG tablet Take 1,000 mcg by mouth daily.   donepezil 10 MG tablet Commonly known as:  ARICEPT Take 10 mg by mouth daily.   folic acid 1 MG tablet Commonly known as:  FOLVITE Take 1 mg by mouth daily.   hydrocortisone cream 1 % Apply 1  application topically daily.   ketoconazole 2 % cream Commonly known as:  NIZORAL Apply 1 application topically daily. Apply to areas of the face   losartan 50 MG tablet Commonly known as:  COZAAR Take 50 mg by mouth daily. Reduced due to hypotension.   methotrexate 2.5 MG tablet Commonly known as:  RHEUMATREX Take 2.5 mg by mouth once a week. Caution:Chemotherapy. Protect from light.  Pt takes on Tuesday evenings... Was changed by MD- per pts daughter   PREVIDENT 5000 DRY MOUTH 1.1 % Gel dental gel Generic drug:  sodium fluoride Please brush regularly with prescribed toothpaste only per Dr. Dana Allan DDS instruction on 7/10 during office visit. May keep in room   traMADol 50 MG tablet Commonly known as:   ULTRAM Take 50 mg by mouth every 4 (four) hours as needed.       Review of Systems  Unable to perform ROS: Dementia  Constitutional: Negative for activity change, appetite change, chills, diaphoresis and fever.  HENT: Negative.  Negative for congestion, sneezing, sore throat, trouble swallowing and voice change.   Respiratory: Negative for apnea, cough, choking, chest tightness, shortness of breath and wheezing.   Cardiovascular: Negative for chest pain, palpitations and leg swelling.  Gastrointestinal: Negative for abdominal distention, abdominal pain, constipation, diarrhea and nausea.  Genitourinary: Negative for difficulty urinating, dysuria, frequency and urgency.  Musculoskeletal: Negative.  Negative for back pain, gait problem and myalgias. Arthralgias: typical arthritis.  Skin: Negative for color change, pallor, rash and wound.  Neurological: Negative for dizziness, tremors, seizures, syncope, speech difficulty, weakness, numbness and headaches.  Psychiatric/Behavioral: Negative for agitation and behavioral problems.  All other systems reviewed and are negative.   Immunization History  Administered Date(s) Administered  . Influenza-Unspecified 08/13/2016, 08/31/2017  . PPD Test 02/01/2017  . Pneumococcal-Unspecified 08/13/2016   Pertinent  Health Maintenance Due  Topic Date Due  . PNA vac Low Risk Adult (2 of 2 - PCV13) 08/13/2017  . INFLUENZA VACCINE  Completed   No flowsheet data found. Functional Status Survey:    Vitals:   10/04/17 1219  BP: 110/65  Pulse: 81  Resp: 18  Temp: 98.2 F (36.8 C)  SpO2: 95%  Weight: 179 lb 6.4 oz (81.4 kg)  Height: _0  (1.778 m)   Body mass index is 25.74 kg/m. Physical Exam  Constitutional: He is oriented to person, place, and time. Vital signs are normal. He appears well-developed and well-nourished. He is active and cooperative. He does not appear ill. No distress.  HENT:  Head: Normocephalic and atraumatic.   Mouth/Throat: Uvula is midline, oropharynx is clear and moist and mucous membranes are normal. Mucous membranes are not pale, not dry and not cyanotic.  Eyes: Pupils are equal, round, and reactive to light. Conjunctivae, EOM and lids are normal.  Neck: Trachea normal, normal range of motion and full passive range of motion without pain. Neck supple. No JVD present. No tracheal deviation, no edema and no erythema present. No thyromegaly present.  Cardiovascular: Normal rate, regular rhythm, normal heart sounds, intact distal pulses and normal pulses.  Exam reveals no gallop, no distant heart sounds and no friction rub.   No murmur heard. Pulses:      Dorsalis pedis pulses are 2+ on the right side, and 2+ on the left side.  No edema  Pulmonary/Chest: Effort normal and breath sounds normal. No accessory muscle usage. No respiratory distress. He has no decreased breath sounds. He has no wheezes. He has no rhonchi. He  has no rales. He exhibits no tenderness.  Abdominal: Soft. Normal appearance and bowel sounds are normal. He exhibits no distension and no ascites. There is no tenderness.  Musculoskeletal: Normal range of motion. He exhibits no edema or tenderness.  Expected osteoarthritis, stiffness  Neurological: He is alert and oriented to person, place, and time. He has normal strength. He displays no tremor. He displays no seizure activity. Coordination and gait abnormal.  Skin: Skin is warm, dry and intact. No rash noted. He is not diaphoretic. No cyanosis or erythema. No pallor. Nails show no clubbing.  Psychiatric: He has a normal mood and affect. His speech is normal and behavior is normal. Judgment and thought content normal. Cognition and memory are normal.  Nursing note and vitals reviewed.   Labs reviewed:  Recent Labs  11/01/16 0353 11/02/16 0403  02/14/17 0600 02/24/17 0450 03/23/17 1400  NA  --  138  < > 138 137 137  K  --  4.3  < > 4.1 4.0 4.0  CL  --  106  --  107 107 105   CO2  --  27  --  _0 GLUCOSE  --  115*  --  96 95 111*  BUN  --  17  < > _1 CREATININE  --  1.04  < > 0.75 0.76 0.96  CALCIUM  --  9.1  --  8.6* 8.4* 8.8*  MG 1.9 2.0  --  1.9  --   --   PHOS 3.2 3.8  --   --   --   --   < > = values in this interval not displayed.  Recent Labs  02/14/17 0600 02/24/17 0450 03/23/17 1400  AST 19 16 39  ALT 21 22 32  ALKPHOS 67 61 78  BILITOT 0.8 0.5 0.9  PROT 6.1* 6.1* 6.6  ALBUMIN 3.1* 3.1* 3.4*    Recent Labs  02/14/17 0600 02/24/17 0450 03/23/17 1400  WBC 5.7 5.7 10.1  NEUTROABS 3.8 3.8 9.7*  HGB 14.1 14.0 12.8*  HCT 41.7 41.1 39.1*  MCV 81.6 82.0 84.5  PLT 142* 165 143*   Lab Results  Component Value Date   TSH 2.980 02/14/2017   No results found for: HGBA1C No results found for: CHOL, HDL, LDLCALC, LDLDIRECT, TRIG, CHOLHDL  Significant Diagnostic Results in last 30 days:  No results found.  Assessment/Plan 1. Late onset Alzheimer's disease without behavioral disturbance  Stable   Continue donepezil tablet; 10 mg po Q HS  2. Dementia associated with other underlying disease without behavioral disturbance  Stable  3. Major depressive disorder with single episode, remission status unspecified  Stable  Continue citalopram tablet; 10 mg; 1 tablet po Q Day  4. Chronic pulmonary thromboembolism syndrome (HCC)  Stable  Continue Eliquis (apixaban) tablet; 5 mg po BID  5. Other nonspecific abnormal finding of lung field  Stable  6. Rheumatoid arthritis involving both hands, unspecified rheumatoid factor presence (HCC)  Stable  Continue calcium carbonate-vitamin D3 [OTC] tablet; 500 mg(1,273m) -200 unit; amt: 1 tablet PO BID  Continue folic acid [OTC] tablet; 1 mg PO Q Day  Continue methotrexate sodium tablet; 2.5 mg po Q Week on Tuesdays  7. Essential hypertension  Stable  Continue losartan tablet; 50 mg; 1 tablet po Q Day  8. Vitamin B12 deficiency  Stable  Continue cyanocobalamin  (vitamin B-12) [OTC] tablet; 1,000 mcg 1 tablet po Q Day  Continue all medications as listed above unless otherwise  indicated  Family/ staff Communication:   Total Time:  Documentation:  Face to Face:  Family/Phone:   Labs/tests ordered: CBC, met C, TSH, B12, D, mag  Medication list reviewed and assessed for continued appropriateness. Monthly medication orders reviewed and signed.  Vikki Ports, NP-C Geriatrics [facility] [phone], [place] [address] Cell Phone (Mon-Fri 8am-5pm):  [phone] On Call:  [phone] & follow prompts after 5pm & weekends Office Phone:  [phone] Office Fax:  [phone]

## 2017-10-13 ENCOUNTER — Encounter
Admission: RE | Admit: 2017-10-13 | Discharge: 2017-10-13 | Disposition: A | Discharge status: Home / Self Care | Payer: Medicare Other | Source: Ambulatory Visit | Attending: Internal Medicine | Admitting: Internal Medicine

## 2017-10-20 ENCOUNTER — Other Ambulatory Visit
Admission: RE | Admit: 2017-10-20 | Discharge: 2017-10-20 | Disposition: A | Discharge status: Home / Self Care | Payer: Medicare Other | Source: Ambulatory Visit | Attending: Gerontology | Admitting: Gerontology

## 2017-10-20 DIAGNOSIS — F0280 Dementia in other diseases classified elsewhere without behavioral disturbance: Secondary | ICD-10-CM | POA: Insufficient documentation

## 2017-10-20 LAB — CBC WITH DIFFERENTIAL/PLATELET
BASOS ABS: 0 10*3/uL (ref 0–0.1)
BASOS PCT: 1 %
Eosinophils Absolute: 0.2 10*3/uL (ref 0–0.7)
Eosinophils Relative: 3 %
HEMATOCRIT: 41.5 % (ref 40.0–52.0)
HEMOGLOBIN: 13.7 g/dL (ref 13.0–18.0)
LYMPHS PCT: 18 %
Lymphs Abs: 0.9 10*3/uL — ABNORMAL LOW (ref 1.0–3.6)
MCH: 27.7 pg (ref 26.0–34.0)
MCHC: 32.9 g/dL (ref 32.0–36.0)
MCV: 83.9 fL (ref 80.0–100.0)
MONO ABS: 0.6 10*3/uL (ref 0.2–1.0)
Monocytes Relative: 11 %
NEUTROS ABS: 3.4 10*3/uL (ref 1.4–6.5)
NEUTROS PCT: 67 %
Platelets: 161 10*3/uL (ref 150–440)
RBC: 4.95 MIL/uL (ref 4.40–5.90)
RDW: 15.4 % — AB (ref 11.5–14.5)
WBC: 5.1 10*3/uL (ref 3.8–10.6)

## 2017-10-20 LAB — COMPREHENSIVE METABOLIC PANEL
ALBUMIN: 3 g/dL — AB (ref 3.5–5.0)
ALK PHOS: 61 U/L (ref 38–126)
ALT: 28 U/L (ref 17–63)
AST: 18 U/L (ref 15–41)
Anion gap: 8 (ref 5–15)
BILIRUBIN TOTAL: 0.5 mg/dL (ref 0.3–1.2)
BUN: 12 mg/dL (ref 6–20)
CALCIUM: 8.5 mg/dL — AB (ref 8.9–10.3)
CO2: 24 mmol/L (ref 22–32)
CREATININE: 0.77 mg/dL (ref 0.61–1.24)
Chloride: 105 mmol/L (ref 101–111)
GFR calc Af Amer: >60 mL/min (ref >60)
GFR calc non Af Amer: >60 mL/min (ref >60)
GLUCOSE: 114 mg/dL — AB (ref 65–99)
Potassium: 4 mmol/L (ref 3.5–5.1)
SODIUM: 137 mmol/L (ref 135–145)
TOTAL PROTEIN: 6.1 g/dL — AB (ref 6.5–8.1)

## 2017-10-20 LAB — VITAMIN B12: Vitamin B-12: 819 pg/mL (ref 180–914)

## 2017-10-20 LAB — MAGNESIUM: Magnesium: 1.9 mg/dL (ref 1.7–2.4)

## 2017-10-20 LAB — TSH: TSH: 2.698 u[IU]/mL (ref 0.350–4.500)

## 2017-10-21 LAB — VITAMIN D 25 HYDROXY (VIT D DEFICIENCY, FRACTURES): Vit D, 25-Hydroxy: 36.4 ng/mL (ref 30.0–100.0)

## 2017-11-01 ENCOUNTER — Non-Acute Institutional Stay (SKILLED_NURSING_FACILITY): Payer: Medicare Other | Admitting: Gerontology

## 2017-11-01 ENCOUNTER — Encounter: Payer: Self-pay | Admitting: Gerontology

## 2017-11-01 DIAGNOSIS — L309 Dermatitis, unspecified: Secondary | ICD-10-CM | POA: Diagnosis not present

## 2017-11-01 DIAGNOSIS — M069 Rheumatoid arthritis, unspecified: Secondary | ICD-10-CM | POA: Diagnosis not present

## 2017-11-01 DIAGNOSIS — E441 Mild protein-calorie malnutrition: Secondary | ICD-10-CM

## 2017-11-01 DIAGNOSIS — F0280 Dementia in other diseases classified elsewhere without behavioral disturbance: Secondary | ICD-10-CM | POA: Diagnosis not present

## 2017-11-01 DIAGNOSIS — I10 Essential (primary) hypertension: Secondary | ICD-10-CM

## 2017-11-01 DIAGNOSIS — E538 Deficiency of other specified B group vitamins: Secondary | ICD-10-CM

## 2017-11-01 DIAGNOSIS — R918 Other nonspecific abnormal finding of lung field: Secondary | ICD-10-CM | POA: Diagnosis not present

## 2017-11-01 DIAGNOSIS — G301 Alzheimer's disease with late onset: Secondary | ICD-10-CM

## 2017-11-01 DIAGNOSIS — F329 Major depressive disorder, single episode, unspecified: Secondary | ICD-10-CM | POA: Diagnosis not present

## 2017-11-01 DIAGNOSIS — I2782 Chronic pulmonary embolism: Secondary | ICD-10-CM

## 2017-11-12 ENCOUNTER — Encounter
Admission: RE | Admit: 2017-11-12 | Discharge: 2017-11-12 | Disposition: A | Discharge status: Home / Self Care | Payer: Medicare Other | Source: Ambulatory Visit | Attending: Internal Medicine | Admitting: Internal Medicine

## 2017-11-14 ENCOUNTER — Ambulatory Visit: Payer: Medicare Other | Admitting: Podiatry

## 2017-11-15 ENCOUNTER — Other Ambulatory Visit
Admission: RE | Admit: 2017-11-15 | Discharge: 2017-11-15 | Disposition: A | Discharge status: Home / Self Care | Payer: Medicare Other | Source: Ambulatory Visit | Attending: Gerontology | Admitting: Gerontology

## 2017-11-15 DIAGNOSIS — F0280 Dementia in other diseases classified elsewhere without behavioral disturbance: Secondary | ICD-10-CM | POA: Insufficient documentation

## 2017-11-15 LAB — COMPREHENSIVE METABOLIC PANEL
ALK PHOS: 63 U/L (ref 38–126)
ALT: 24 U/L (ref 17–63)
AST: 18 U/L (ref 15–41)
Albumin: 3 g/dL — ABNORMAL LOW (ref 3.5–5.0)
Anion gap: 9 (ref 5–15)
BILIRUBIN TOTAL: 0.3 mg/dL (ref 0.3–1.2)
BUN: 15 mg/dL (ref 6–20)
CALCIUM: 8.4 mg/dL — AB (ref 8.9–10.3)
CO2: 23 mmol/L (ref 22–32)
CREATININE: 0.8 mg/dL (ref 0.61–1.24)
Chloride: 105 mmol/L (ref 101–111)
Glucose, Bld: 102 mg/dL — ABNORMAL HIGH (ref 65–99)
Potassium: 4.2 mmol/L (ref 3.5–5.1)
Sodium: 137 mmol/L (ref 135–145)
TOTAL PROTEIN: 6 g/dL — AB (ref 6.5–8.1)

## 2017-11-15 LAB — CBC WITH DIFFERENTIAL/PLATELET
Basophils Absolute: 0 10*3/uL (ref 0–0.1)
Basophils Relative: 1 %
EOS ABS: 0.2 10*3/uL (ref 0–0.7)
Eosinophils Relative: 4 %
HCT: 42.6 % (ref 40.0–52.0)
HEMOGLOBIN: 14 g/dL (ref 13.0–18.0)
Lymphocytes Relative: 24 %
Lymphs Abs: 1.2 10*3/uL (ref 1.0–3.6)
MCH: 27.4 pg (ref 26.0–34.0)
MCHC: 32.9 g/dL (ref 32.0–36.0)
MCV: 83.4 fL (ref 80.0–100.0)
Monocytes Absolute: 0.6 10*3/uL (ref 0.2–1.0)
Monocytes Relative: 13 %
NEUTROS ABS: 2.8 10*3/uL (ref 1.4–6.5)
Neutrophils Relative %: 58 %
Platelets: 161 10*3/uL (ref 150–440)
RBC: 5.11 MIL/uL (ref 4.40–5.90)
RDW: 15 % — ABNORMAL HIGH (ref 11.5–14.5)
WBC: 4.8 10*3/uL (ref 3.8–10.6)

## 2017-11-15 LAB — TSH: TSH: 3.097 u[IU]/mL (ref 0.350–4.500)

## 2017-11-15 LAB — VITAMIN B12: VITAMIN B 12: 1313 pg/mL — AB (ref 180–914)

## 2017-11-15 LAB — MAGNESIUM: MAGNESIUM: 1.8 mg/dL (ref 1.7–2.4)

## 2017-11-16 LAB — VITAMIN D 25 HYDROXY (VIT D DEFICIENCY, FRACTURES): Vit D, 25-Hydroxy: 31.1 ng/mL (ref 30.0–100.0)

## 2017-11-21 ENCOUNTER — Ambulatory Visit: Payer: Medicare Other | Admitting: Podiatry

## 2017-11-28 ENCOUNTER — Encounter: Payer: Self-pay | Admitting: Podiatry

## 2017-11-28 ENCOUNTER — Ambulatory Visit (INDEPENDENT_AMBULATORY_CARE_PROVIDER_SITE_OTHER): Payer: Medicare Other | Admitting: Podiatry

## 2017-11-28 DIAGNOSIS — B351 Tinea unguium: Secondary | ICD-10-CM

## 2017-11-28 DIAGNOSIS — M79676 Pain in unspecified toe(s): Secondary | ICD-10-CM | POA: Diagnosis not present

## 2017-11-28 DIAGNOSIS — D689 Coagulation defect, unspecified: Secondary | ICD-10-CM

## 2017-11-28 NOTE — Progress Notes (Signed)
Complaint:  Visit Type: Patient returns to my office for continued preventative foot care services. Complaint: Patient states" my nails have grown long and thick and become painful to walk and wear shoes" The patient presents for preventative foot care services. No changes to ROS.  Patient is taking eliquiss.  Podiatric Exam: Vascular: dorsalis pedis and posterior tibial pulses are palpable bilateral. Capillary return is immediate. Temperature gradient is WNL. Skin turgor WNL  Sensorium: Normal Semmes Weinstein monofilament test. Normal tactile sensation bilaterally. Nail Exam: Pt has thick disfigured discolored nails with subungual debris noted bilateral entire nail hallux . Ulcer Exam: There is no evidence of ulcer or pre-ulcerative changes or infection. Orthopedic Exam: Muscle tone and strength are WNL. No limitations in general ROM. No crepitus or effusions noted. Foot type and digits show no abnormalities. Bony prominences are unremarkable. Skin: No Porokeratosis. No infection or ulcers  Diagnosis:  Onychomycosis, , Pain in right toe, pain in left toes  Treatment & Plan Procedures and Treatment: Consent by patient was obtained for treatment procedures.   Debridement of mycotic and hypertrophic toenails, 1 through 5 bilateral and clearing of subungual debris. No ulceration, no infection noted.  Return Visit-Office Procedure: Patient instructed to return to the office for a follow up visit 3 months for continued evaluation and treatment.    Helane Gunther DPM

## 2017-11-30 ENCOUNTER — Non-Acute Institutional Stay (SKILLED_NURSING_FACILITY): Payer: Medicare Other | Admitting: Gerontology

## 2017-11-30 ENCOUNTER — Encounter: Payer: Self-pay | Admitting: Gerontology

## 2017-11-30 DIAGNOSIS — R918 Other nonspecific abnormal finding of lung field: Secondary | ICD-10-CM

## 2017-11-30 DIAGNOSIS — E538 Deficiency of other specified B group vitamins: Secondary | ICD-10-CM

## 2017-11-30 DIAGNOSIS — E441 Mild protein-calorie malnutrition: Secondary | ICD-10-CM | POA: Diagnosis not present

## 2017-11-30 DIAGNOSIS — F329 Major depressive disorder, single episode, unspecified: Secondary | ICD-10-CM | POA: Diagnosis not present

## 2017-11-30 DIAGNOSIS — M069 Rheumatoid arthritis, unspecified: Secondary | ICD-10-CM | POA: Diagnosis not present

## 2017-11-30 DIAGNOSIS — G301 Alzheimer's disease with late onset: Secondary | ICD-10-CM | POA: Diagnosis not present

## 2017-11-30 DIAGNOSIS — F0280 Dementia in other diseases classified elsewhere without behavioral disturbance: Secondary | ICD-10-CM

## 2017-11-30 DIAGNOSIS — I10 Essential (primary) hypertension: Secondary | ICD-10-CM | POA: Diagnosis not present

## 2017-11-30 DIAGNOSIS — L309 Dermatitis, unspecified: Secondary | ICD-10-CM | POA: Diagnosis not present

## 2017-11-30 DIAGNOSIS — I2782 Chronic pulmonary embolism: Secondary | ICD-10-CM | POA: Diagnosis not present

## 2017-12-13 ENCOUNTER — Encounter
Admission: RE | Admit: 2017-12-13 | Discharge: 2017-12-13 | Disposition: A | Discharge status: Home / Self Care | Payer: Medicare Other | Source: Ambulatory Visit | Attending: Internal Medicine | Admitting: Internal Medicine

## 2017-12-15 DIAGNOSIS — E46 Unspecified protein-calorie malnutrition: Secondary | ICD-10-CM | POA: Insufficient documentation

## 2017-12-15 DIAGNOSIS — L309 Dermatitis, unspecified: Secondary | ICD-10-CM | POA: Insufficient documentation

## 2017-12-15 NOTE — Progress Notes (Signed)
Location:   [facility] Room Number: 308A Place of Service:  SNF [phone]) Provider:  Lorenso Quarry, NP-C  Einar Crow MD  Patient Care Team: Einar Crow MD- General  Extended Emergency Contact Information Primary Emergency Contact: Caviness,Carol Address: [address]          [place], [place] [address] [place] Home Phone: [phone] Relation: Other  Code Status:  DNR Goals of care: Advanced Directive information Advanced Directives 11/30/2017  Does Patient Have a Medical Advance Directive? Yes  Type of Advance Directive Out of facility DNR (pink MOST or yellow form)  Does patient want to make changes to medical advance directive? No - Patient declined  Copy of Healthcare Power of Attorney in Chart? -  Pre-existing out of facility DNR order (yellow form or pink MOST form) -     Chief Complaint  Patient presents with  . Medical Management of Chronic Issues    Routine Visit    HPI:  Pt is a 84 y.o. male seen today for medical management of chronic diseases.  Patient has been stable this past month.  No falls.  Patient has been eating well.  Having regular BMs.  Patient enjoys socializing with other residents in the common area.  He is mobile on the unit in a wheelchair.  Patient denies pain.  He is typically smiling and pleasant.  Vital signs stable.  No other complaints.  Past Medical History:  Diagnosis Date  . Alzheimer's dementia   . Dementia arising in the senium and presenium 02/22/2017  . HTN, goal below 150/90 02/22/2017   Losartan  . Hypertension   . Major depression in remission ([facility]) 02/22/2017  . Pulmonary embolism without acute cor pulmonale ([facility]) 02/22/2017  . RA (rheumatoid arthritis) ([facility])    Past Surgical History:  Procedure Laterality Date  . JOINT REPLACEMENT     left hip replacement 09-19-16    Allergies  Allergen Reactions  . Morphine Sulfate   . Penicillins Other (See Comments)    Family thinks  pt is allergic to penicillin but not sure     Allergies as of 11/30/2017      Reactions   Morphine Sulfate    Penicillins Other (See Comments)   Family thinks pt is allergic to penicillin but not sure       Medication List        Accurate as of 11/30/17 11:59 PM. Always use your most recent med list.          acetaminophen 325 MG tablet Commonly known as:  TYLENOL Take 650 mg by mouth 4 (four) times daily. 8 am, 1 pm, 5 pm, 9 pm   apixaban 5 MG Tabs tablet Commonly known as:  ELIQUIS Take 1 tablet (5 mg total) by mouth 2 (two) times daily. Start after 10 mg tabs are finished   calcium-vitamin D 500-200 MG-UNIT tablet Commonly known as:  OSCAL WITH D Take 1 tablet by mouth 2 (two) times daily.   citalopram 10 MG tablet Commonly known as:  CELEXA Take 5 mg by mouth daily. Add 1/2 tab (5 mg)   cyanocobalamin 500 MCG tablet Take 500 mcg by mouth daily.   donepezil 10 MG tablet Commonly known as:  ARICEPT Take 10 mg by mouth daily.   ENSURE ENLIVE PO Take 1 Bottle by mouth daily.   folic acid 1 MG tablet Commonly known as:  FOLVITE Take 1 mg by mouth daily.   hydrocortisone cream 1 %  Apply 1 application topically daily as needed. For red flaky patches on face   ketoconazole 2 % cream Commonly known as:  NIZORAL Apply 1 application topically daily as needed. Apply to red flaky areas of face   losartan 50 MG tablet Commonly known as:  COZAAR Take 50 mg by mouth daily. Reduced due to hypotension.   methotrexate 2.5 MG tablet Commonly known as:  RHEUMATREX Take 2.5 mg by mouth once a week. Caution:Chemotherapy. Protect from light.  Pt takes on Tuesday evenings... Was changed by MD- per pts daughter   PREVIDENT 5000 DRY MOUTH 1.1 % Gel dental gel Generic drug:  sodium fluoride Please brush regularly with prescribed toothpaste only per Dr. Eartha Inch DDS instruction on 7/10 during office visit. May keep in room   traMADol 50 MG tablet Commonly known  as:  ULTRAM Take 50 mg by mouth every 4 (four) hours as needed.       Review of Systems  Unable to perform ROS: Dementia  Constitutional: Negative for activity change, appetite change, chills, diaphoresis and fever.  HENT: Negative.  Negative for congestion, mouth sores, nosebleeds, postnasal drip, sneezing, sore throat, trouble swallowing and voice change.   Respiratory: Negative for apnea, cough, choking, chest tightness, shortness of breath and wheezing.   Cardiovascular: Negative for chest pain, palpitations and leg swelling.  Gastrointestinal: Negative for abdominal distention, abdominal pain, constipation, diarrhea and nausea.  Genitourinary: Negative for difficulty urinating, dysuria, frequency and urgency.  Musculoskeletal: Negative.  Negative for back pain, gait problem and myalgias. Arthralgias: typical arthritis.  Skin: Negative for color change, pallor, rash and wound.  Neurological: Negative for dizziness, tremors, seizures, syncope, speech difficulty, weakness, numbness and headaches.  Psychiatric/Behavioral: Negative for agitation and behavioral problems.  All other systems reviewed and are negative.   Immunization History  Administered Date(s) Administered  . Influenza-Unspecified 08/13/2016, 08/31/2017  . PPD Test 02/01/2017  . Pneumococcal-Unspecified 08/13/2016   Pertinent  Health Maintenance Due  Topic Date Due  . PNA vac Low Risk Adult (2 of 2 - PCV13) 08/13/2017  . INFLUENZA VACCINE  Completed   No flowsheet data found. Functional Status Survey:    Vitals:   11/30/17 1002  BP: 118/77  Pulse: 79  Resp: 20  Temp: 98.4 F (36.9 C)  TempSrc: Oral  SpO2: 96%  Weight: 180 lb 4.8 oz (81.8 kg)  Height: 5\' 7"  (1.702 m)   Body mass index is 28.24 kg/m. Physical Exam  Constitutional: He is oriented to person, place, and time. Vital signs are normal. He appears well-developed and well-nourished. He is active and cooperative. He does not appear ill. No  distress.  HENT:  Head: Normocephalic and atraumatic.  Mouth/Throat: Uvula is midline, oropharynx is clear and moist and mucous membranes are normal. Mucous membranes are not pale, not dry and not cyanotic.  Eyes: Conjunctivae, EOM and lids are normal. Pupils are equal, round, and reactive to light.  Neck: Trachea normal, normal range of motion and full passive range of motion without pain. Neck supple. No JVD present. No tracheal deviation, no edema and no erythema present. No thyromegaly present.  Cardiovascular: Normal rate, regular rhythm, normal heart sounds, intact distal pulses and normal pulses. Exam reveals no gallop, no distant heart sounds and no friction rub.  No murmur heard. Pulses:      Dorsalis pedis pulses are 2+ on the right side, and 2+ on the left side.  No edema  Pulmonary/Chest: Effort normal and breath sounds normal. No accessory muscle  usage. No respiratory distress. He has no decreased breath sounds. He has no wheezes. He has no rhonchi. He has no rales. He exhibits no tenderness.  Abdominal: Soft. Normal appearance and bowel sounds are normal. He exhibits no distension and no ascites. There is no tenderness.  Musculoskeletal: Normal range of motion. He exhibits no edema or tenderness.  Expected osteoarthritis, stiffness; generalized weakness; bilateral calves soft, supple.  Negative Homans sign.  Neurological: He is alert and oriented to person, place, and time. He has normal strength. He displays no tremor. He displays no seizure activity. Coordination and gait abnormal.  Skin: Skin is warm, dry and intact. No rash noted. He is not diaphoretic. No cyanosis or erythema. No pallor. Nails show no clubbing.  Psychiatric: He has a normal mood and affect. His speech is normal and behavior is normal. Judgment and thought content normal. Cognition and memory are normal.  Nursing note and vitals reviewed.   Labs reviewed: Recent Labs    02/14/17 0600  03/23/17 1400  10/20/17 0455 11/15/17 0443  NA 138   < > 137 137 137  K 4.1   < > 4.0 4.0 4.2  CL 107   < > 105 105 105  CO2 25   < > 23 24 23   GLUCOSE 96   < > 111* 114* 102*  BUN 12   < > 16 12 15   CREATININE 0.75   < > 0.96 0.77 0.80  CALCIUM 8.6*   < > 8.8* 8.5* 8.4*  MG 1.9  --   --  1.9 1.8   < > = values in this interval not displayed.   Recent Labs    03/23/17 1400 10/20/17 0455 11/15/17 0443  AST 39 18 18  ALT 32 28 24  ALKPHOS 78 61 63  BILITOT 0.9 0.5 0.3  PROT 6.6 6.1* 6.0*  ALBUMIN 3.4* 3.0* 3.0*   Recent Labs    03/23/17 1400 10/20/17 0455 11/15/17 0443  WBC 10.1 5.1 4.8  NEUTROABS 9.7* 3.4 2.8  HGB 12.8* 13.7 14.0  HCT 39.1* 41.5 42.6  MCV 84.5 83.9 83.4  PLT 143* 161 161   Lab Results  Component Value Date   TSH 3.097 11/15/2017   No results found for: HGBA1C No results found for: CHOL, HDL, LDLCALC, LDLDIRECT, TRIG, CHOLHDL  Significant Diagnostic Results in last 30 days:  No results found.  Assessment/Plan 1. Late onset Alzheimer's disease without behavioral disturbance  Stable   Continue donepezil tablet; 10 mg po Q HS  2. Dementia associated with other underlying disease without behavioral disturbance  Stable  3. Major depressive disorder with single episode, remission status unspecified  Stable  Continue citalopram tablet; 5 mg; 1 tablet po Q Day  4. Chronic pulmonary thromboembolism syndrome (HCC)  Stable  Continue Eliquis (apixaban) tablet; 5 mg po BID  5. Other nonspecific abnormal finding of lung field  Stable  6. Rheumatoid arthritis involving both hands, unspecified rheumatoid factor presence (HCC)  Stable  Continue calcium carbonate-vitamin D3 [OTC] tablet; 500 mg(1,250mg ) -200 unit; amt: 1 tablet PO BID  Continue folic acid [OTC] tablet; 1 mg PO Q Day  Continue methotrexate sodium tablet; 2.5 mg po Q Week on Tuesdays  7. Essential hypertension  Stable  Continue losartan tablet; 50 mg; 1 tablet po Q Day  8.  Vitamin B12 deficiency  Stable  Continue cyanocobalamin (vitamin B-12) [OTC] tablet; 1,000 mcg 1 tablet po Q Day  9.  Mild protein calorie malnutrition  Ensure Enlive 1  bottle once daily for nutritional support  10.  Eczema, unspecified type  Stable  Hydrocortisone 1% cream thin film once daily as needed for red flaky patches to face  Ketoconazole 2% cream thin film applied to red flaky areas on face once daily as needed   Continue all medications as listed above (11/30/2017)  Family/ staff Communication:   Total Time:  Documentation:  Face to Face:  Family/Phone:   Labs/tests ordered: Not due  Medication list reviewed and assessed for continued appropriateness. Monthly medication orders reviewed and signed.  Brynda Rim, NP-C Geriatrics [facility] [phone], [place] [address] Cell Phone (Mon-Fri 8am-5pm):  [phone] On Call:  [phone] & follow prompts after 5pm & weekends Office Phone:  [phone] Office Fax:  [phone]

## 2017-12-15 NOTE — Progress Notes (Signed)
Location:   [facility] Room Number: 308 Place of Service:  SNF [phone]) Provider:  Lorenso Quarry, NP-C  Einar Crow MD  Patient Care Team: Einar Crow MD- General  Extended Emergency Contact Information Primary Emergency Contact: Caviness,Carol Address: [address]          [place], [place] [address] [place] Home Phone: [phone] Relation: Other  Code Status:  DNR Goals of care: Advanced Directive information Advanced Directives 11/30/2017  Does Patient Have a Medical Advance Directive? Yes  Type of Advance Directive Out of facility DNR (pink MOST or yellow form)  Does patient want to make changes to medical advance directive? No - Patient declined  Copy of Healthcare Power of Attorney in Chart? -  Pre-existing out of facility DNR order (yellow form or pink MOST form) -     Chief Complaint  Patient presents with  . Medical Management of Chronic Issues    Routine Visit    HPI:  Pt is a 84 y.o. male seen today for medical management of chronic diseases.  Patient has been stable this month.  No falls.  Patient has been eating well.  Having regular BMs.  Patient enjoys socializing with other residents in the common area.  He is mobile on the unit in a wheelchair.  Patient denies pain.  Vital signs stable.  No other complaints.  Past Medical History:  Diagnosis Date  . Alzheimer's dementia   . Dementia arising in the senium and presenium 02/22/2017  . HTN, goal below 150/90 02/22/2017   Losartan  . Hypertension   . Major depression in remission (HCC) 02/22/2017  . Pulmonary embolism without acute cor pulmonale (HCC) 02/22/2017  . RA (rheumatoid arthritis) (HCC)    Past Surgical History:  Procedure Laterality Date  . JOINT REPLACEMENT     left hip replacement 09-19-16    Allergies  Allergen Reactions  . Morphine Sulfate   . Penicillins Other (See Comments)    Family thinks pt is allergic to penicillin but not sure       Allergies as of 11/01/2017      Reactions   Morphine Sulfate    Penicillins Other (See Comments)   Family thinks pt is allergic to penicillin but not sure       Medication List        Accurate as of 11/01/17 11:59 PM. Always use your most recent med list.          acetaminophen 325 MG tablet Commonly known as:  TYLENOL Take 650 mg by mouth 4 (four) times daily. 8 am, 1 pm, 5 pm, 9 pm   apixaban 5 MG Tabs tablet Commonly known as:  ELIQUIS Take 1 tablet (5 mg total) by mouth 2 (two) times daily. Start after 10 mg tabs are finished   calcium-vitamin D 500-200 MG-UNIT tablet Commonly known as:  OSCAL WITH D Take 1 tablet by mouth 2 (two) times daily.   cyanocobalamin 1000 MCG tablet Take 1,000 mcg by mouth daily.   donepezil 10 MG tablet Commonly known as:  ARICEPT Take 10 mg by mouth daily.   folic acid 1 MG tablet Commonly known as:  FOLVITE Take 1 mg by mouth daily.   hydrocortisone cream 1 % Apply 1 application topically daily as needed. For red flaky patches on face   ketoconazole 2 % cream Commonly known as:  NIZORAL Apply 1 application topically daily as needed. Apply to red flaky areas of face  losartan 50 MG tablet Commonly known as:  COZAAR Take 50 mg by mouth daily. Reduced due to hypotension.   methotrexate 2.5 MG tablet Commonly known as:  RHEUMATREX Take 2.5 mg by mouth once a week. Caution:Chemotherapy. Protect from light.  Pt takes on Tuesday evenings... Was changed by MD- per pts daughter   PREVIDENT 5000 DRY MOUTH 1.1 % Gel dental gel Generic drug:  sodium fluoride Please brush regularly with prescribed toothpaste only per Dr. Eartha Inch DDS instruction on 7/10 during office visit. May keep in room   traMADol 50 MG tablet Commonly known as:  ULTRAM Take 50 mg by mouth every 4 (four) hours as needed.       Review of Systems  Unable to perform ROS: Dementia  Constitutional: Negative for activity change, appetite change,  chills, diaphoresis and fever.  HENT: Negative.  Negative for congestion, mouth sores, nosebleeds, postnasal drip, sneezing, sore throat, trouble swallowing and voice change.   Respiratory: Negative for apnea, cough, choking, chest tightness, shortness of breath and wheezing.   Cardiovascular: Negative for chest pain, palpitations and leg swelling.  Gastrointestinal: Negative for abdominal distention, abdominal pain, constipation, diarrhea and nausea.  Genitourinary: Negative for difficulty urinating, dysuria, frequency and urgency.  Musculoskeletal: Negative.  Negative for back pain, gait problem and myalgias. Arthralgias: typical arthritis.  Skin: Negative for color change, pallor, rash and wound.  Neurological: Negative for dizziness, tremors, seizures, syncope, speech difficulty, weakness, numbness and headaches.  Psychiatric/Behavioral: Negative for agitation and behavioral problems.  All other systems reviewed and are negative.   Immunization History  Administered Date(s) Administered  . Influenza-Unspecified 08/13/2016, 08/31/2017  . PPD Test 02/01/2017  . Pneumococcal-Unspecified 08/13/2016   Pertinent  Health Maintenance Due  Topic Date Due  . PNA vac Low Risk Adult (2 of 2 - PCV13) 08/13/2017  . INFLUENZA VACCINE  Completed   No flowsheet data found. Functional Status Survey:    Vitals:   11/01/17 1404  BP: 112/61  Pulse: 76  Resp: 20  Temp: 98.1 F (36.7 C)  TempSrc: Oral  SpO2: 95%  Weight: 176 lb 14.4 oz (80.2 kg)  Height: 5\' 10"  (1.778 m)   Body mass index is 25.38 kg/m. Physical Exam  Constitutional: He is oriented to person, place, and time. Vital signs are normal. He appears well-developed and well-nourished. He is active and cooperative. He does not appear ill. No distress.  HENT:  Head: Normocephalic and atraumatic.  Mouth/Throat: Uvula is midline, oropharynx is clear and moist and mucous membranes are normal. Mucous membranes are not pale, not dry and  not cyanotic.  Eyes: Conjunctivae, EOM and lids are normal. Pupils are equal, round, and reactive to light.  Neck: Trachea normal, normal range of motion and full passive range of motion without pain. Neck supple. No JVD present. No tracheal deviation, no edema and no erythema present. No thyromegaly present.  Cardiovascular: Normal rate, regular rhythm, normal heart sounds, intact distal pulses and normal pulses. Exam reveals no gallop, no distant heart sounds and no friction rub.  No murmur heard. Pulses:      Dorsalis pedis pulses are 2+ on the right side, and 2+ on the left side.  No edema  Pulmonary/Chest: Effort normal and breath sounds normal. No accessory muscle usage. No respiratory distress. He has no decreased breath sounds. He has no wheezes. He has no rhonchi. He has no rales. He exhibits no tenderness.  Abdominal: Soft. Normal appearance and bowel sounds are normal. He exhibits no distension  and no ascites. There is no tenderness.  Musculoskeletal: Normal range of motion. He exhibits no edema or tenderness.  Expected osteoarthritis, stiffness; generalized weakness; bilateral calves soft, supple.  Negative Homans sign.  Neurological: He is alert and oriented to person, place, and time. He has normal strength. He displays no tremor. He displays no seizure activity. Coordination and gait abnormal.  Skin: Skin is warm, dry and intact. No rash noted. He is not diaphoretic. No cyanosis or erythema. No pallor. Nails show no clubbing.  Psychiatric: He has a normal mood and affect. His speech is normal and behavior is normal. Judgment and thought content normal. Cognition and memory are normal.  Nursing note and vitals reviewed.   Labs reviewed: Recent Labs    02/14/17 0600  03/23/17 1400 10/20/17 0455 11/15/17 0443  NA 138   < > 137 137 137  K 4.1   < > 4.0 4.0 4.2  CL 107   < > 105 105 105  CO2 25   < > 23 24 23   GLUCOSE 96   < > 111* 114* 102*  BUN 12   < > 16 12 15   CREATININE  0.75   < > 0.96 0.77 0.80  CALCIUM 8.6*   < > 8.8* 8.5* 8.4*  MG 1.9  --   --  1.9 1.8   < > = values in this interval not displayed.   Recent Labs    03/23/17 1400 10/20/17 0455 11/15/17 0443  AST 39 18 18  ALT 32 28 24  ALKPHOS 78 61 63  BILITOT 0.9 0.5 0.3  PROT 6.6 6.1* 6.0*  ALBUMIN 3.4* 3.0* 3.0*   Recent Labs    03/23/17 1400 10/20/17 0455 11/15/17 0443  WBC 10.1 5.1 4.8  NEUTROABS 9.7* 3.4 2.8  HGB 12.8* 13.7 14.0  HCT 39.1* 41.5 42.6  MCV 84.5 83.9 83.4  PLT 143* 161 161   Lab Results  Component Value Date   TSH 3.097 11/15/2017   No results found for: HGBA1C No results found for: CHOL, HDL, LDLCALC, LDLDIRECT, TRIG, CHOLHDL  Significant Diagnostic Results in last 30 days:  No results found.  Assessment/Plan 1. Late onset Alzheimer's disease without behavioral disturbance  Stable   Continue donepezil tablet; 10 mg po Q HS  2. Dementia associated with other underlying disease without behavioral disturbance  Stable  3. Major depressive disorder with single episode, remission status unspecified  Stable  Decrease citalopram tablet; 5 mg; 1 tablet po Q Day  4. Chronic pulmonary thromboembolism syndrome (HCC)  Stable  Continue Eliquis (apixaban) tablet; 5 mg po BID  5. Other nonspecific abnormal finding of lung field  Stable  6. Rheumatoid arthritis involving both hands, unspecified rheumatoid factor presence (HCC)  Stable  Continue calcium carbonate-vitamin D3 [OTC] tablet; 500 mg(1,250mg ) -200 unit; amt: 1 tablet PO BID  Continue folic acid [OTC] tablet; 1 mg PO Q Day  Continue methotrexate sodium tablet; 2.5 mg po Q Week on Tuesdays  7. Essential hypertension  Stable  Continue losartan tablet; 50 mg; 1 tablet po Q Day  8. Vitamin B12 deficiency  Stable  Continue cyanocobalamin (vitamin B-12) [OTC] tablet; 1,000 mcg 1 tablet po Q Day  9.  Mild protein calorie malnutrition  Ensure Enlive 1 bottle once daily for nutritional  support  10.  Eczema, unspecified type  Stable  Hydrocortisone 1% cream thin film once daily as needed for red flaky patches to face  Ketoconazole 2% cream thin film applied to red flaky  areas on face once daily as needed   Continue all medications as listed above (10/31/2017)  Family/ staff Communication:   Total Time:  Documentation:  Face to Face:  Family/Phone:   Labs/tests ordered: Not due  Medication list reviewed and assessed for continued appropriateness. Monthly medication orders reviewed and signed.  Brynda Rim, NP-C Geriatrics [facility] [phone] [address] [place], [place] [address] Cell Phone (Mon-Fri 8am-5pm):  [phone] On Call:  [phone] & follow prompts after 5pm & weekends Office Phone:  [phone] Office Fax:  [phone]

## 2017-12-26 IMAGING — CT CT HEAD W/O CM
3 series · 15 of 47 positions shown, 18 images · non-contrast
Comparison: October 15, 2016

CLINICAL DATA: Seizure

EXAM:
CT HEAD WITHOUT CONTRAST
TECHNIQUE: Contiguous axial images were obtained from the base of the skull
through the vertex without intravenous contrast.

[Series 2: head wo · axial · 0.47mm/px · z∈[-166,-36]mm · 9 of 32 slices shown, 12 images]
[im 3/32  brain]
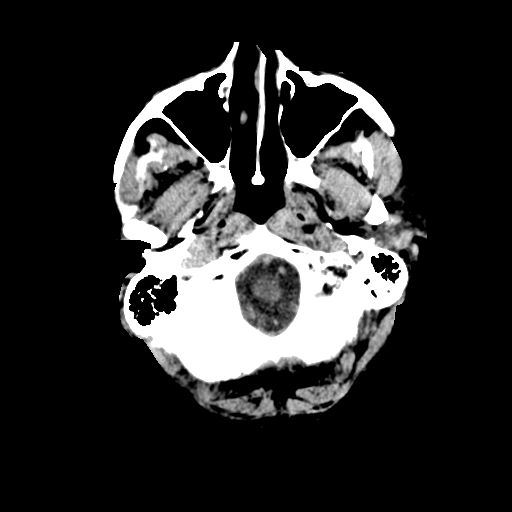
[im 3/32  bone]
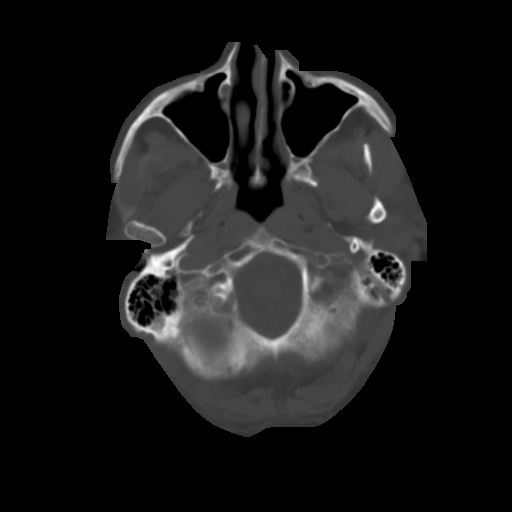
[im 6/32  brain]
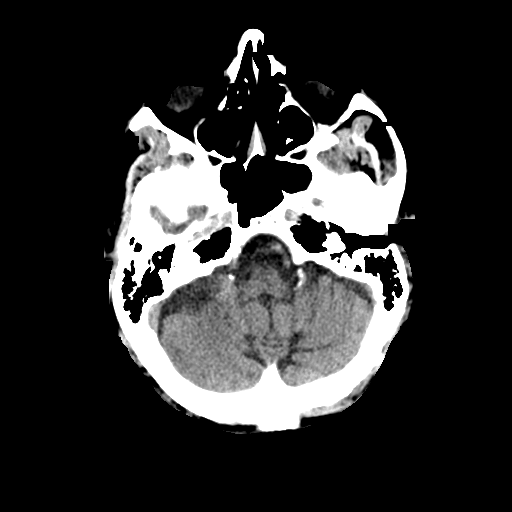
[im 9/32  brain]
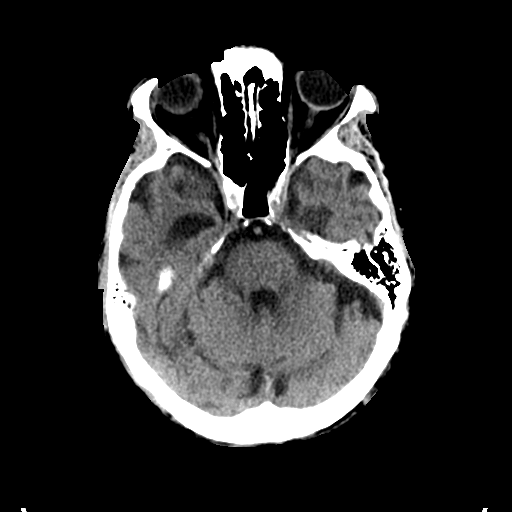
[im 12/32  brain]
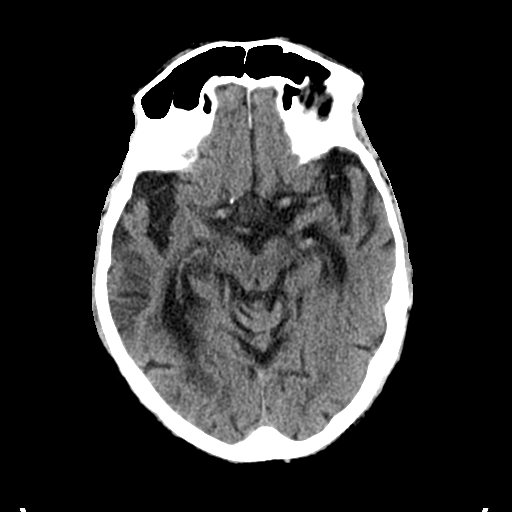
[im 17/32  brain]
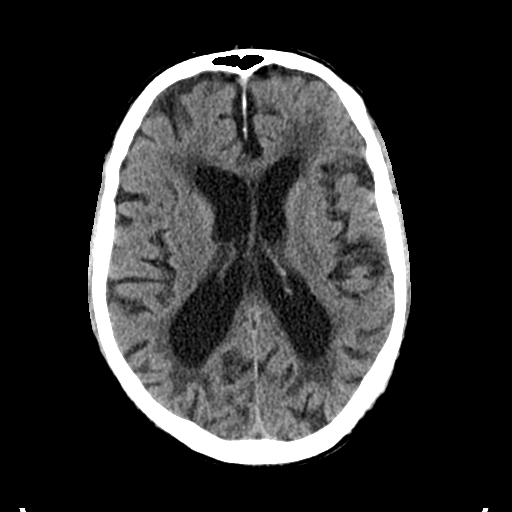
[im 17/32  bone]
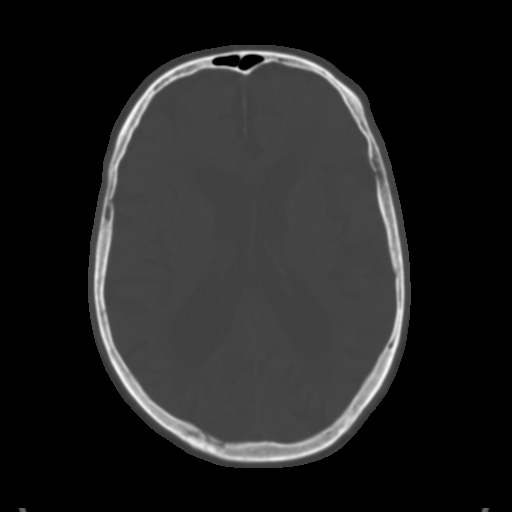
[im 20/32  brain]
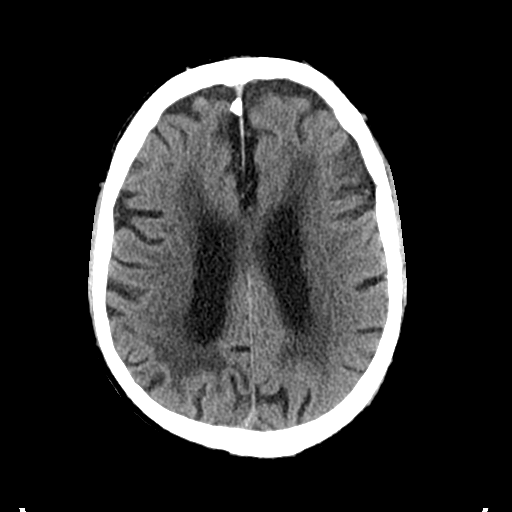
[im 23/32  brain]
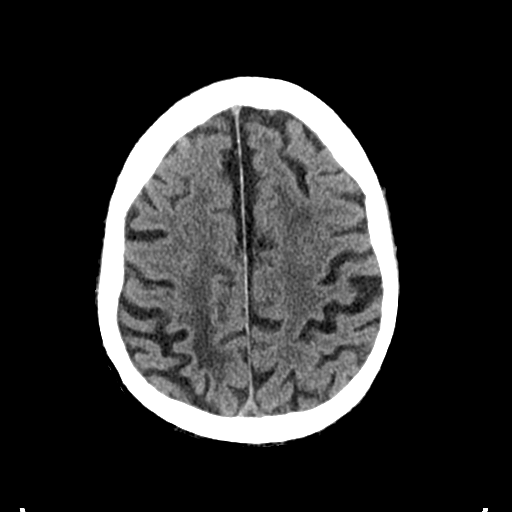
[im 26/32  brain]
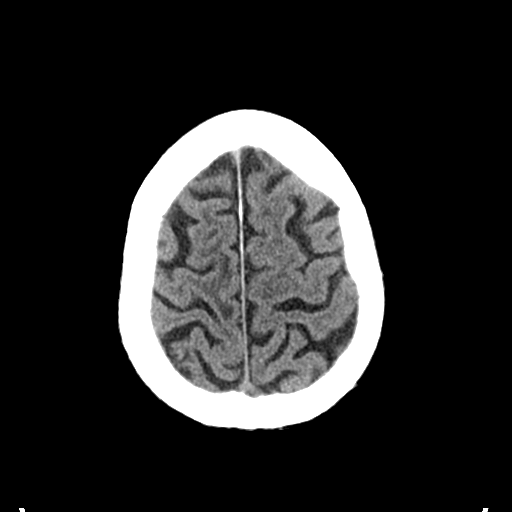
[im 29/32  brain]
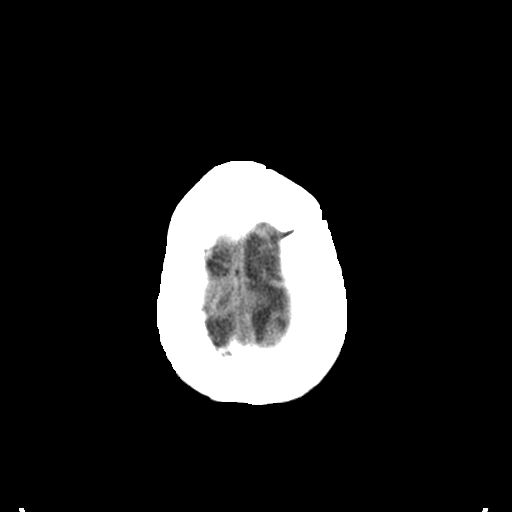
[im 29/32  bone]
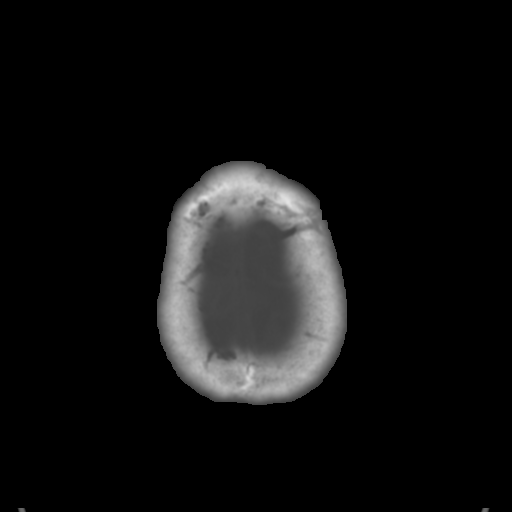

[Series 4: coronal soft tissue · coronal · 0.32mm/px · 3 of 69 slices shown]
[im 23/69  brain]
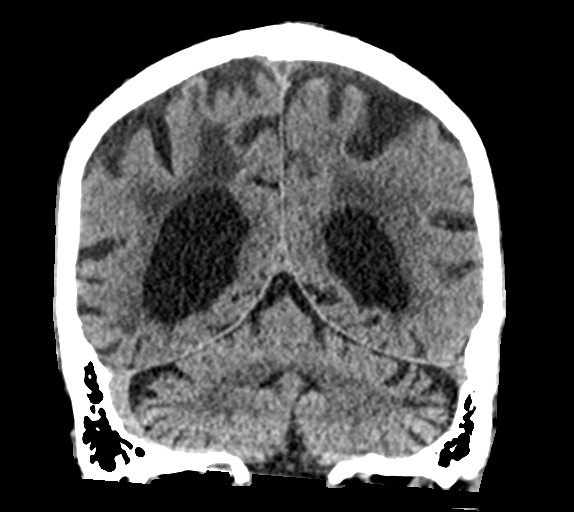
[im 31/69  brain]
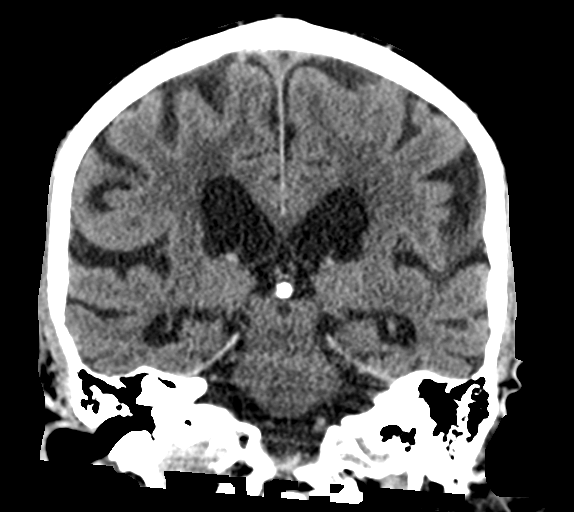
[im 38/69  brain]
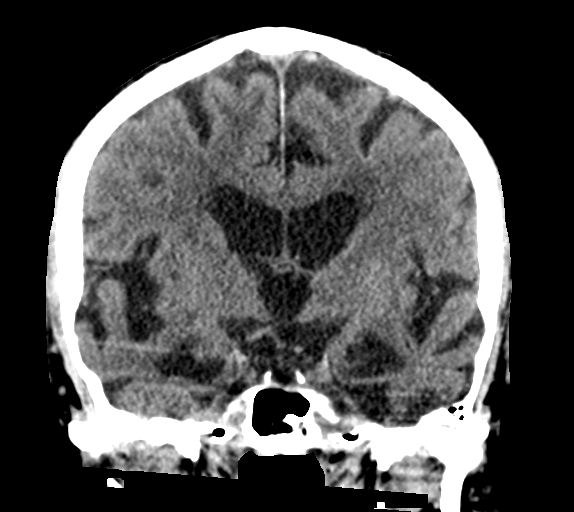

[Series 5: sagittal soft tissue · sagittal · 0.32mm/px · 3 of 56 slices shown]
[im 19/56  brain]
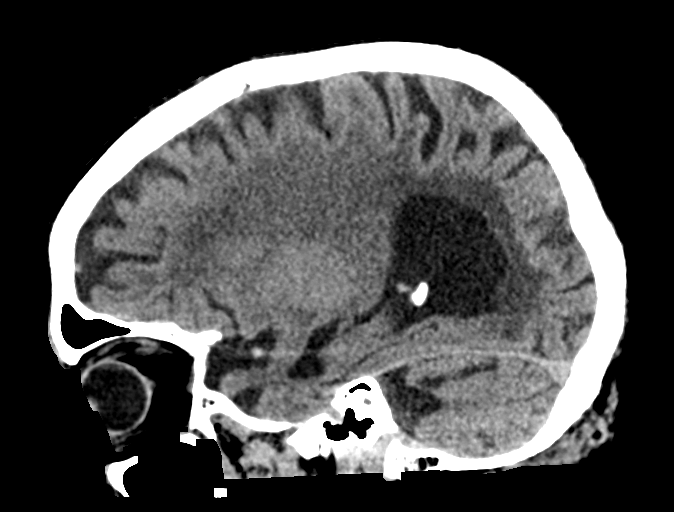
[im 28/56  brain]
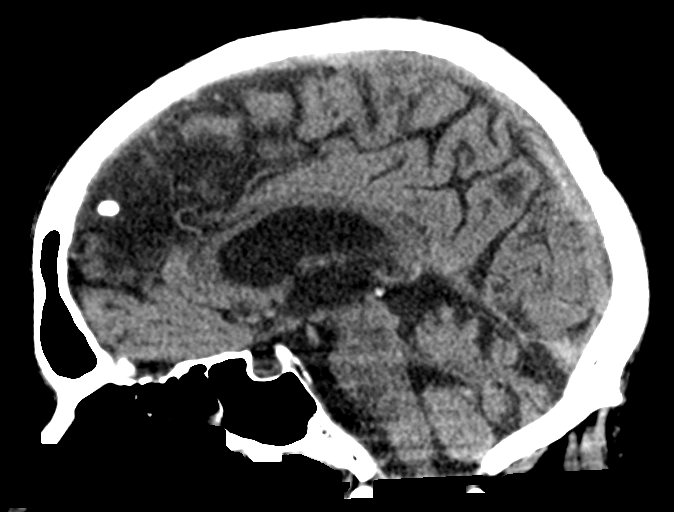
[im 37/56  brain]
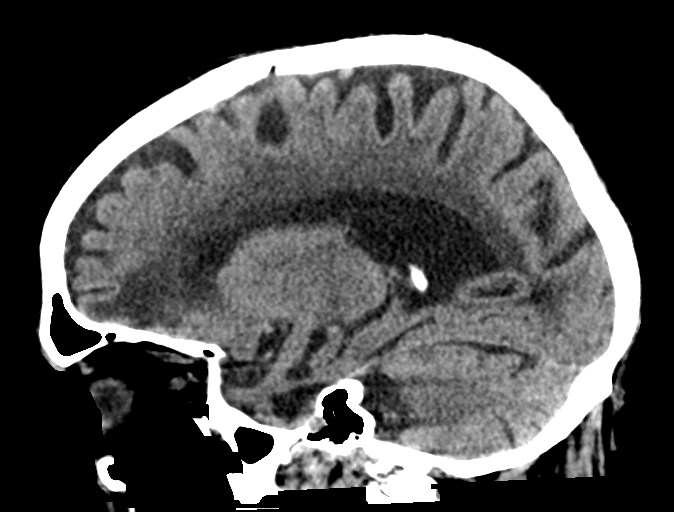

[15 of 47 positions shown; findings below may reference images not displayed]

FINDINGS: Brain: Moderate diffuse atrophy is stable. There is no intracranial
mass, hemorrhage, extra-axial fluid collection, or midline shift.
There is small vessel disease throughout the centra semiovale
bilaterally. There is evidence of a prior infarct in the medial
aspect of the inferior left frontal lobe, stable. There is no new
gray-white compartment lesion. No acute infarct is demonstrable.

Vascular: There is no evident hyperdense vessel. There is
calcification in each carotid siphon region. There is calcification
in the proximal basilar artery and in each distal vertebral artery.

Skull: Bony calvarium appears intact.

Sinuses/Orbits: There is mucosal thickening in multiple ethmoid air
cells bilaterally. There are areas of mucosal thickening in each
maxillary antrum. Other visualized paranasal sinuses are clear.
Orbits appear symmetric bilaterally except for evidence of previous
cataract removal on the left.

Other: Mastoid air cells are clear.
IMPRESSION: Stable diffuse atrophy. Extensive periventricular small vessel
disease, stable. Prior frontal lobe infarct. No intracranial mass,
hemorrhage, or evidence of acute infarct. Foci of arterial vascular
calcification noted. There are areas of paranasal sinus disease.

## 2018-01-06 ENCOUNTER — Non-Acute Institutional Stay (SKILLED_NURSING_FACILITY): Payer: Medicare Other | Admitting: Gerontology

## 2018-01-06 ENCOUNTER — Encounter: Payer: Self-pay | Admitting: Gerontology

## 2018-01-06 DIAGNOSIS — I10 Essential (primary) hypertension: Secondary | ICD-10-CM | POA: Diagnosis not present

## 2018-01-06 DIAGNOSIS — F0280 Dementia in other diseases classified elsewhere without behavioral disturbance: Secondary | ICD-10-CM

## 2018-01-06 DIAGNOSIS — G301 Alzheimer's disease with late onset: Secondary | ICD-10-CM | POA: Diagnosis not present

## 2018-01-06 DIAGNOSIS — I2699 Other pulmonary embolism without acute cor pulmonale: Secondary | ICD-10-CM | POA: Diagnosis not present

## 2018-01-10 ENCOUNTER — Non-Acute Institutional Stay (SKILLED_NURSING_FACILITY): Payer: Medicare Other | Admitting: Gerontology

## 2018-01-10 DIAGNOSIS — L02212 Cutaneous abscess of back [any part, except buttock]: Secondary | ICD-10-CM | POA: Diagnosis not present

## 2018-01-13 ENCOUNTER — Encounter
Admission: RE | Admit: 2018-01-13 | Discharge: 2018-01-13 | Disposition: A | Discharge status: Home / Self Care | Payer: Medicare Other | Source: Ambulatory Visit | Attending: Internal Medicine | Admitting: Internal Medicine

## 2018-01-16 NOTE — Progress Notes (Signed)
Location:    Nursing Home Room Number: 308A Place of Service:  SNF (31) Provider:  Lorenso Quarry, NP-C  System, Pcp Not In  Patient Care Team: System, Pcp Not In as PCP - General  Extended Emergency Contact Information Primary Emergency Contact: Caviness,Carol Address: [address]          [place], [place] [address] [place] Home Phone: [phone] Relation: Other  Code Status:  DNR Goals of care: Advanced Directive information Advanced Directives 01/06/2018  Does Patient Have a Medical Advance Directive? Yes  Type of Advance Directive Out of facility DNR (pink MOST or yellow form)  Does patient want to make changes to medical advance directive? No - Patient declined  Copy of Healthcare Power of Attorney in Chart? -  Pre-existing out of facility DNR order (yellow form or pink MOST form) -     Chief Complaint  Patient presents with  . Medical Management of Chronic Issues    Routine Visit    HPI:  Pt is a 84 y.o. male seen today for medical management of chronic diseases.    Pulmonary embolism without acute cor pulmonale (HCC) Stable. No c/o chest pain or shortness of breath. Pt continues on Eliquis 5 mg BID for DVT prophylaxis.   HTN (hypertension) Stable. No recent episodes of hypotension. No edema. Symptoms managed with Cozaar  Late onset Alzheimer's disease without behavioral disturbance Stable. Pt is pleasant. Rarely any behavioral concerns. Stable on Aricept. Minimally verbal.   Please note pt with limited verbal ability. Unable to obtain complete ROS. Some ROS info obtained from staff and documentation.    Past Medical History:  Diagnosis Date  . Alzheimer's dementia   . Dementia arising in the senium and presenium 02/22/2017  . HTN, goal below 150/90 02/22/2017   Losartan  . Hypertension   . Major depression in remission (HCC) 02/22/2017  . Pulmonary embolism without acute cor pulmonale (HCC) 02/22/2017  . RA (rheumatoid arthritis) (HCC)     Past Surgical History:  Procedure Laterality Date  . JOINT REPLACEMENT     left hip replacement 09-19-16    Allergies  Allergen Reactions  . Morphine Sulfate   . Penicillins Other (See Comments)    Family thinks pt is allergic to penicillin but not sure     Allergies as of 01/06/2018      Reactions   Morphine Sulfate    Penicillins Other (See Comments)   Family thinks pt is allergic to penicillin but not sure       Medication List        Accurate as of 01/06/18 11:59 PM. Always use your most recent med list.          acetaminophen 325 MG tablet Commonly known as:  TYLENOL Take 650 mg by mouth 4 (four) times daily as needed. 8 am, 1 pm, 5 pm, 9 pm   apixaban 5 MG Tabs tablet Commonly known as:  ELIQUIS Take 1 tablet (5 mg total) by mouth 2 (two) times daily. Start after 10 mg tabs are finished   calcium-vitamin D 500-200 MG-UNIT tablet Commonly known as:  OSCAL WITH D Take 1 tablet by mouth 2 (two) times daily.   citalopram 10 MG tablet Commonly known as:  CELEXA Take 5 mg by mouth daily. Add 1/2 tab (5 mg)   cyanocobalamin 500 MCG tablet Take 500 mcg by mouth daily.   donepezil 10 MG tablet Commonly known as:  ARICEPT Take 10 mg by mouth daily.  ENSURE ENLIVE PO Take 1 Bottle by mouth daily.   folic acid 1 MG tablet Commonly known as:  FOLVITE Take 1 mg by mouth daily.   hydrocortisone cream 1 % Apply 1 application topically daily as needed. For red flaky patches on face   ketoconazole 2 % cream Commonly known as:  NIZORAL Apply 1 application topically daily as needed. Apply to red flaky areas of face   losartan 50 MG tablet Commonly known as:  COZAAR Take 50 mg by mouth daily. Reduced due to hypotension.   methotrexate 2.5 MG tablet Commonly known as:  RHEUMATREX Take 2.5 mg by mouth once a week. Caution:Chemotherapy. Protect from light.  Pt takes on Tuesday evenings... Was changed by MD- per pts daughter   PREVIDENT 5000 DRY MOUTH 1.1 %  Gel dental gel Generic drug:  sodium fluoride Please brush regularly with prescribed toothpaste only per Dr. Eartha Inch DDS instruction on 7/10 during office visit. May keep in room   traMADol 50 MG tablet Commonly known as:  ULTRAM Take 50 mg by mouth every 4 (four) hours as needed.       Review of Systems  Unable to perform ROS: Dementia  Constitutional: Negative for activity change, appetite change, chills, diaphoresis and fever.  HENT: Negative for congestion, mouth sores, nosebleeds, postnasal drip, sneezing, sore throat, trouble swallowing and voice change.   Respiratory: Negative for apnea, cough, choking, chest tightness, shortness of breath and wheezing.   Cardiovascular: Negative for chest pain, palpitations and leg swelling.  Gastrointestinal: Negative for abdominal distention, abdominal pain, constipation, diarrhea and nausea.  Genitourinary: Negative for difficulty urinating, dysuria, frequency and urgency.  Musculoskeletal: Positive for arthralgias (typical arthritis). Negative for back pain, gait problem and myalgias.  Skin: Negative for color change, pallor, rash and wound.  Neurological: Positive for weakness. Negative for dizziness, tremors, syncope, speech difficulty, numbness and headaches.  Psychiatric/Behavioral: Negative for agitation and behavioral problems.  All other systems reviewed and are negative.   Immunization History  Administered Date(s) Administered  . Influenza-Unspecified 08/13/2016, 08/31/2017  . PPD Test 02/01/2017  . Pneumococcal-Unspecified 08/13/2016   Pertinent  Health Maintenance Due  Topic Date Due  . PNA vac Low Risk Adult (2 of 2 - PCV13) 08/13/2017  . INFLUENZA VACCINE  Completed   No flowsheet data found. Functional Status Survey:    Vitals:   01/06/18 1416  BP: 108/65  Pulse: 86  Resp: 16  Temp: 98.4 F (36.9 C)  TempSrc: Oral  SpO2: 95%  Weight: 182 lb 9.6 oz (82.8 kg)  Height: 5\' 7"  (1.702 m)   Body mass  index is 28.6 kg/m. Physical Exam  Constitutional: He is oriented to person, place, and time. Vital signs are normal. He appears well-developed and well-nourished. He is active and cooperative. He does not appear ill. No distress.  HENT:  Head: Normocephalic and atraumatic.  Mouth/Throat: Uvula is midline, oropharynx is clear and moist and mucous membranes are normal. Mucous membranes are not pale, not dry and not cyanotic.  Eyes: Conjunctivae, EOM and lids are normal. Pupils are equal, round, and reactive to light.  Neck: Trachea normal, normal range of motion and full passive range of motion without pain. Neck supple. No JVD present. No tracheal deviation, no edema and no erythema present. No thyromegaly present.  Cardiovascular: Normal rate, regular rhythm, normal heart sounds, intact distal pulses and normal pulses. Exam reveals no gallop, no distant heart sounds and no friction rub.  No murmur heard. Pulses:  Dorsalis pedis pulses are 2+ on the right side, and 2+ on the left side.  No edema  Pulmonary/Chest: Effort normal and breath sounds normal. No accessory muscle usage. No respiratory distress. He has no decreased breath sounds. He has no wheezes. He has no rhonchi. He has no rales. He exhibits no tenderness.  Abdominal: Soft. Normal appearance and bowel sounds are normal. He exhibits no distension and no ascites. There is no tenderness.  Musculoskeletal: Normal range of motion. He exhibits no edema or tenderness.  Expected osteoarthritis, stiffness; Bilateral Calves soft, supple. Negative Homan's Sign. B- pedal pulses equal; generalized weakness, mobile on unit in wheelchair  Neurological: He is alert and oriented to person, place, and time. He has normal strength. He displays atrophy. A cranial nerve deficit and sensory deficit is present. He exhibits abnormal muscle tone. Coordination and gait abnormal.  Skin: Skin is warm, dry and intact. He is not diaphoretic. No cyanosis. No  pallor. Nails show no clubbing.  Psychiatric: He has a normal mood and affect. His speech is normal and behavior is normal. Judgment and thought content normal. Cognition and memory are impaired. He exhibits abnormal recent memory and abnormal remote memory.  Nursing note and vitals reviewed.   Labs reviewed: Recent Labs    02/14/17 0600  03/23/17 1400 10/20/17 0455 11/15/17 0443  NA 138   < > 137 137 137  K 4.1   < > 4.0 4.0 4.2  CL 107   < > 105 105 105  CO2 25   < > 23 24 23   GLUCOSE 96   < > 111* 114* 102*  BUN 12   < > 16 12 15   CREATININE 0.75   < > 0.96 0.77 0.80  CALCIUM 8.6*   < > 8.8* 8.5* 8.4*  MG 1.9  --   --  1.9 1.8   < > = values in this interval not displayed.   Recent Labs    03/23/17 1400 10/20/17 0455 11/15/17 0443  AST 39 18 18  ALT 32 28 24  ALKPHOS 78 61 63  BILITOT 0.9 0.5 0.3  PROT 6.6 6.1* 6.0*  ALBUMIN 3.4* 3.0* 3.0*   Recent Labs    03/23/17 1400 10/20/17 0455 11/15/17 0443  WBC 10.1 5.1 4.8  NEUTROABS 9.7* 3.4 2.8  HGB 12.8* 13.7 14.0  HCT 39.1* 41.5 42.6  MCV 84.5 83.9 83.4  PLT 143* 161 161   Lab Results  Component Value Date   TSH 3.097 11/15/2017   No results found for: HGBA1C No results found for: CHOL, HDL, LDLCALC, LDLDIRECT, TRIG, CHOLHDL  Significant Diagnostic Results in last 30 days:  No results found.  Assessment/Plan Joseph [name] was seen today for medical management of chronic issues.  Diagnoses and all orders for this visit:  Other pulmonary embolism without acute cor pulmonale, unspecified chronicity (HCC)  Essential hypertension  Late onset Alzheimer's disease without behavioral disturbance   Above listed conditions stable  Continue current medication regimen  Monitor respiratory status  Assist with ADLs as necessary  Anticipate needs  Family/ staff Communication:   Total Time:  Documentation:  Face to Face:  Family/Phone:   Labs/tests ordered:  Not due  Medication list reviewed and  assessed for continued appropriateness. Monthly medication orders reviewed and signed.  Brynda Rim, NP-C Geriatrics [facility] [phone] [address], [place] [address] Cell Phone (Mon-Fri 8am-5pm):  [phone] On Call:  [phone] & follow prompts after 5pm & weekends  Office Phone:  [phone] Office Fax:  [phone]

## 2018-01-16 NOTE — Assessment & Plan Note (Signed)
Stable. No c/o chest pain or shortness of breath. Pt continues on Eliquis 5 mg BID for DVT prophylaxis.

## 2018-01-16 NOTE — Assessment & Plan Note (Signed)
Stable. Pt is pleasant. Rarely any behavioral concerns. Stable on Aricept. Minimally verbal.

## 2018-01-16 NOTE — Assessment & Plan Note (Signed)
Stable. No recent episodes of hypotension. No edema. Symptoms managed with Cozaar

## 2018-02-06 NOTE — Progress Notes (Signed)
Location:      Place of Service:  SNF (31) Provider:  Lorenso Quarry, NP-C  System, Pcp Not In  Patient Care Team: System, Pcp Not In as PCP - General  Extended Emergency Contact Information Primary Emergency Contact: Caviness,Carol Address: [address]          [place], [place] [address] [place] Home Phone: [phone] Relation: Other  Code Status:  DNR Goals of care: Advanced Directive information Advanced Directives 01/06/2018  Does Patient Have a Medical Advance Directive? Yes  Type of Advance Directive Out of facility DNR (pink MOST or yellow form)  Does patient want to make changes to medical advance directive? No - Patient declined  Copy of Healthcare Power of Attorney in Chart? -  Pre-existing out of facility DNR order (yellow form or pink MOST form) -     Chief Complaint  Patient presents with  . Acute Visit    HPI:  Pt is a 84 y.o. [name] seen today for an acute visit for a painful "lump" on the right shoulder blade of the pt. Staff noticed the lump yesterday. Area ~ 3 cm diameter, raised, red, warm to touch. The area is pliable. Tender to palpation. Surrounding tissue with induration. No open areas. No drainage. Pt has been afebrile. No c/o pain to the area with not being touched. VSS. No other complaints.     Past Medical History:  Diagnosis Date  . Alzheimer's dementia   . Dementia arising in the senium and presenium 02/22/2017  . HTN, goal below 150/90 02/22/2017   Losartan  . Hypertension   . Major depression in remission (HCC) 02/22/2017  . Pulmonary embolism without acute cor pulmonale (HCC) 02/22/2017  . RA (rheumatoid arthritis) (HCC)    Past Surgical History:  Procedure Laterality Date  . JOINT REPLACEMENT     left hip replacement 09-19-16    Allergies  Allergen Reactions  . Morphine Sulfate   . Penicillins Other (See Comments)    Family thinks pt is allergic to penicillin but not sure     Allergies as of 01/10/2018    Reactions   Morphine Sulfate    Penicillins Other (See Comments)   Family thinks pt is allergic to penicillin but not sure       Medication List        Accurate as of 01/10/18 11:59 PM. Always use your most recent med list.          acetaminophen 325 MG tablet Commonly known as:  TYLENOL Take 650 mg by mouth 4 (four) times daily as needed. 8 am, 1 pm, 5 pm, 9 pm   apixaban 5 MG Tabs tablet Commonly known as:  ELIQUIS Take 1 tablet (5 mg total) by mouth 2 (two) times daily. Start after 10 mg tabs are finished   calcium-vitamin D 500-200 MG-UNIT tablet Commonly known as:  OSCAL WITH D Take 1 tablet by mouth 2 (two) times daily.   citalopram 10 MG tablet Commonly known as:  CELEXA Take 5 mg by mouth daily. Add 1/2 tab (5 mg)   cyanocobalamin 500 MCG tablet Take 500 mcg by mouth daily.   donepezil 10 MG tablet Commonly known as:  ARICEPT Take 10 mg by mouth daily.   ENSURE ENLIVE PO Take 1 Bottle by mouth daily.   folic acid 1 MG tablet Commonly known as:  FOLVITE Take 1 mg by mouth daily.   hydrocortisone cream 1 % Apply 1 application topically daily as needed. For  red flaky patches on face   ketoconazole 2 % cream Commonly known as:  NIZORAL Apply 1 application topically daily as needed. Apply to red flaky areas of face   losartan 50 MG tablet Commonly known as:  COZAAR Take 50 mg by mouth daily. Reduced due to hypotension.   methotrexate 2.5 MG tablet Commonly known as:  RHEUMATREX Take 2.5 mg by mouth once a week. Caution:Chemotherapy. Protect from light.  Pt takes on Tuesday evenings... Was changed by MD- per pts daughter   PREVIDENT 5000 DRY MOUTH 1.1 % Gel dental gel Generic drug:  sodium fluoride Please brush regularly with prescribed toothpaste only per Dr. Eartha Inch DDS instruction on 7/10 during office visit. May keep in room   traMADol 50 MG tablet Commonly known as:  ULTRAM Take 50 mg by mouth every 4 (four) hours as needed.        Review of Systems  Unable to perform ROS: Dementia  Constitutional: Negative for activity change, appetite change, chills, diaphoresis and fever.  HENT: Negative for congestion, mouth sores, nosebleeds, postnasal drip, sneezing, sore throat, trouble swallowing and voice change.   Respiratory: Negative for apnea, cough, choking, chest tightness, shortness of breath and wheezing.   Cardiovascular: Negative for chest pain, palpitations and leg swelling.  Gastrointestinal: Negative.   Genitourinary: Negative.   Musculoskeletal: Positive for arthralgias (typical arthritis). Negative for back pain, gait problem and myalgias.  Skin: Positive for color change and wound. Negative for pallor and rash.  Neurological: Positive for weakness. Negative for dizziness, tremors, syncope, speech difficulty, numbness and headaches.  Psychiatric/Behavioral: Negative for agitation and behavioral problems.  All other systems reviewed and are negative.   Immunization History  Administered Date(s) Administered  . Influenza-Unspecified 08/13/2016, 08/31/2017  . PPD Test 02/01/2017  . Pneumococcal-Unspecified 08/13/2016   Pertinent  Health Maintenance Due  Topic Date Due  . PNA vac Low Risk Adult (2 of 2 - PCV13) 08/13/2017  . INFLUENZA VACCINE  Completed   No flowsheet data found. Functional Status Survey:    Vitals:   01/10/18 0000  BP: (!) 100/56  Pulse: 82  Resp: 20  Temp: 98.6 F (37 C)  SpO2: 96%   There is no height or weight on file to calculate BMI. Physical Exam  Constitutional: He is oriented to person, place, and time. Vital signs are normal. He appears well-developed and well-nourished. He is active and cooperative. He does not appear ill. No distress.  HENT:  Head: Normocephalic and atraumatic.  Mouth/Throat: Uvula is midline, oropharynx is clear and moist and mucous membranes are normal. Mucous membranes are not pale, not dry and not cyanotic.  Eyes: Conjunctivae, EOM and lids  are normal. Pupils are equal, round, and reactive to light.  Neck: Trachea normal, normal range of motion and full passive range of motion without pain. Neck supple. No JVD present. No tracheal deviation, no edema and no erythema present. No thyromegaly present.  Cardiovascular: Normal rate, regular rhythm, normal heart sounds, intact distal pulses and normal pulses. Exam reveals no gallop, no distant heart sounds and no friction rub.  No murmur heard. Pulses:      Dorsalis pedis pulses are 2+ on the right side, and 2+ on the left side.  No edema  Pulmonary/Chest: Effort normal and breath sounds normal. No accessory muscle usage. No respiratory distress. He has no decreased breath sounds. He has no wheezes. He has no rhonchi. He has no rales. He exhibits no tenderness.  Abdominal: Soft. Normal appearance  and bowel sounds are normal. He exhibits no distension and no ascites. There is no tenderness.  Musculoskeletal: Normal range of motion. He exhibits no edema or tenderness.  Expected osteoarthritis, stiffness; Bilateral Calves soft, supple. Negative Homan's Sign. B- pedal pulses equal; generalized weakness; mobile in wheelchair  Neurological: He is alert and oriented to person, place, and time. He has normal strength. He displays atrophy. He exhibits abnormal muscle tone. Coordination and gait abnormal.  Skin: Skin is warm, dry and intact. Lesion noted. He is not diaphoretic. There is erythema. No cyanosis. No pallor. Nails show no clubbing.     Psychiatric: He has a normal mood and affect. His speech is normal and behavior is normal. Judgment and thought content normal. Cognition and memory are normal.  Nursing note and vitals reviewed.   Labs reviewed: Recent Labs    02/14/17 0600  03/23/17 1400 10/20/17 0455 11/15/17 0443  NA 138   < > 137 137 137  K 4.1   < > 4.0 4.0 4.2  CL 107   < > 105 105 105  CO2 25   < > 23 24 23   GLUCOSE 96   < > 111* 114* 102*  BUN 12   < > 16 12 15    CREATININE 0.75   < > 0.96 0.77 0.80  CALCIUM 8.6*   < > 8.8* 8.5* 8.4*  MG 1.9  --   --  1.9 1.8   < > = values in this interval not displayed.   Recent Labs    03/23/17 1400 10/20/17 0455 11/15/17 0443  AST 39 18 18  ALT 32 28 24  ALKPHOS 78 61 63  BILITOT 0.9 0.5 0.3  PROT 6.6 6.1* 6.0*  ALBUMIN 3.4* 3.0* 3.0*   Recent Labs    03/23/17 1400 10/20/17 0455 11/15/17 0443  WBC 10.1 5.1 4.8  NEUTROABS 9.7* 3.4 2.8  HGB 12.8* 13.7 14.0  HCT 39.1* 41.5 42.6  MCV 84.5 83.9 83.4  PLT 143* 161 161   Lab Results  Component Value Date   TSH 3.097 11/15/2017   No results found for: HGBA1C No results found for: CHOL, HDL, LDLCALC, LDLDIRECT, TRIG, CHOLHDL  Significant Diagnostic Results in last 30 days:  No results found.  Assessment/Plan Landyn was seen today for acute visit.  Diagnoses and all orders for this visit:  Abscess of back    Doxycycline 100 mg po Q 12 hours x 14 days  Very warm, moist compresses with massage QID until resolved  Cover with Allevyn Dressing for padding, change Q week and prn  Omeprazole 20 mg po Q 12 hours while on antibiotics  Family/ staff Communication:   Total Time:  Documentation:  Face to Face:  Family/Phone:   Labs/tests ordered:   Medication list reviewed and assessed for continued appropriateness.  14/03/2017, NP-C Geriatrics [facility] [phone] [address], [address] [place] Cell Phone (Mon-Fri 8am-5pm):  [phone] On Call:  [phone] & follow prompts after 5pm & weekends Office Phone:  [phone] Office Fax:  [phone]

## 2018-02-07 ENCOUNTER — Non-Acute Institutional Stay (SKILLED_NURSING_FACILITY): Payer: Medicare Other | Admitting: Gerontology

## 2018-02-07 ENCOUNTER — Encounter: Payer: Self-pay | Admitting: Gerontology

## 2018-02-07 DIAGNOSIS — L309 Dermatitis, unspecified: Secondary | ICD-10-CM

## 2018-02-07 DIAGNOSIS — E538 Deficiency of other specified B group vitamins: Secondary | ICD-10-CM

## 2018-02-07 DIAGNOSIS — M069 Rheumatoid arthritis, unspecified: Secondary | ICD-10-CM

## 2018-02-08 ENCOUNTER — Other Ambulatory Visit
Admission: RE | Admit: 2018-02-08 | Discharge: 2018-02-08 | Disposition: A | Discharge status: Home / Self Care | Payer: Medicare Other | Source: Ambulatory Visit | Attending: Gerontology | Admitting: Gerontology

## 2018-02-08 DIAGNOSIS — L02818 Cutaneous abscess of other sites: Secondary | ICD-10-CM | POA: Diagnosis present

## 2018-02-10 ENCOUNTER — Encounter
Admission: RE | Admit: 2018-02-10 | Discharge: 2018-02-10 | Disposition: A | Discharge status: Home / Self Care | Payer: Medicare Other | Source: Ambulatory Visit | Attending: Internal Medicine | Admitting: Internal Medicine

## 2018-02-11 LAB — AEROBIC CULTURE W GRAM STAIN (SUPERFICIAL SPECIMEN): Culture: NO GROWTH

## 2018-02-11 LAB — AEROBIC CULTURE  (SUPERFICIAL SPECIMEN)

## 2018-02-27 ENCOUNTER — Ambulatory Visit: Payer: Medicare Other | Admitting: Podiatry

## 2018-03-02 ENCOUNTER — Encounter: Payer: Self-pay | Admitting: Podiatry

## 2018-03-02 ENCOUNTER — Ambulatory Visit (INDEPENDENT_AMBULATORY_CARE_PROVIDER_SITE_OTHER): Payer: Medicare Other | Admitting: Podiatry

## 2018-03-02 DIAGNOSIS — D689 Coagulation defect, unspecified: Secondary | ICD-10-CM

## 2018-03-02 DIAGNOSIS — M79676 Pain in unspecified toe(s): Secondary | ICD-10-CM

## 2018-03-02 DIAGNOSIS — B351 Tinea unguium: Secondary | ICD-10-CM | POA: Diagnosis not present

## 2018-03-02 NOTE — Progress Notes (Signed)
Complaint:  Visit Type: Patient returns to my office for continued preventative foot care services. Complaint: Patient states" my nails have grown long and thick and become painful to walk and wear shoes" The patient presents for preventative foot care services. No changes to ROS.  Patient is taking eliquiss. Patient is with his daughter.  Podiatric Exam: Vascular: dorsalis pedis and posterior tibial pulses are palpable bilateral. Capillary return is immediate. Temperature gradient is WNL. Skin turgor WNL  Sensorium: Normal Semmes Weinstein monofilament test. Normal tactile sensation bilaterally. Nail Exam: Pt has thick disfigured discolored nails with subungual debris noted bilateral entire nail hallux . Ulcer Exam: There is no evidence of ulcer or pre-ulcerative changes or infection. Orthopedic Exam: Muscle tone and strength are WNL. No limitations in general ROM. No crepitus or effusions noted. Foot type and digits show no abnormalities. Bony prominences are unremarkable. Skin: No Porokeratosis. No infection or ulcers  Diagnosis:  Onychomycosis, , Pain in right toe, pain in left toes  Treatment & Plan Procedures and Treatment: Consent by patient was obtained for treatment procedures.   Debridement of mycotic and hypertrophic toenails, 1 through 5 bilateral and clearing of subungual debris. No ulceration, no infection noted.  Return Visit-Office Procedure: Patient instructed to return to the office for a follow up visit 3 months for continued evaluation and treatment.    Odell Fasching DPM 

## 2018-03-07 ENCOUNTER — Non-Acute Institutional Stay (SKILLED_NURSING_FACILITY): Payer: Medicare Other | Admitting: Gerontology

## 2018-03-07 DIAGNOSIS — R569 Unspecified convulsions: Secondary | ICD-10-CM

## 2018-03-07 DIAGNOSIS — G301 Alzheimer's disease with late onset: Secondary | ICD-10-CM | POA: Diagnosis not present

## 2018-03-07 DIAGNOSIS — F02818 Dementia in other diseases classified elsewhere, unspecified severity, with other behavioral disturbance: Secondary | ICD-10-CM

## 2018-03-07 DIAGNOSIS — F0281 Dementia in other diseases classified elsewhere with behavioral disturbance: Secondary | ICD-10-CM | POA: Diagnosis not present
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
## 2018-03-07 NOTE — Assessment & Plan Note (Signed)
Stable.  No seizure-like activity noted for several months.  No shaking, tremors, altered level of consciousness, etc. no medicinal treatment required

## 2018-03-07 NOTE — Assessment & Plan Note (Signed)
Stable.  Not currently having a flare.  Patient has hydrocortisone cream 1% twice daily as needed application and ketoconazole 2% cream 3 times a week scheduled for the rash.

## 2018-03-07 NOTE — Assessment & Plan Note (Signed)
Stable.  Last lab check, levels were elevated at 1313.  Dose of cyanocobalamin reduced to 500 mcg p.o. daily.

## 2018-03-07 NOTE — Progress Notes (Signed)
Location:    Nursing Home Room Number: 308A Place of Service:  SNF (31) Provider:  Lorenso Quarry, NP-C  System, Pcp Not In  Patient Care Team: System, Pcp Not In as PCP - General  Extended Emergency Contact Information Primary Emergency Contact: Caviness,Carol Address: [address]          [place], [place] [address] [place] Home Phone: [phone] Relation: Other  Code Status: DNR Goals of care: Advanced Directive information Advanced Directives 02/07/2018  Does Patient Have a Medical Advance Directive? Yes  Type of Advance Directive Out of facility DNR (pink MOST or yellow form)  Does patient want to make changes to medical advance directive? No - Patient declined  Copy of Healthcare Power of Attorney in Chart? -  Pre-existing out of facility DNR order (yellow form or pink MOST form) Yellow form placed in chart (order not valid for inpatient use)     Chief Complaint  Patient presents with  . Medical Management of Chronic Issues    Routine Visit    HPI:  Pt is a 84 y.o. male seen today for medical management of chronic diseases.    Eczema Stable.  Not currently having a flare.  Patient has hydrocortisone cream 1% twice daily as needed application and ketoconazole 2% cream 3 times a week scheduled for the rash.  Arthritis, rheumatoid (HCC) Stable.  No current complaints of symptoms.  Patient continues on methotrexate 2.5 mg p.o. weekly on Tuesday evenings for suppression of symptoms.  Vitamin B12 deficiency Stable.  Last lab check, levels were elevated at 1313.  Dose of cyanocobalamin reduced to 500 mcg p.o. daily.  Please note pt with limited verbal/cognitive ability. Unable to obtain complete ROS. Some ROS info obtained from staff and documentation.   Past Medical History:  Diagnosis Date  . Alzheimer's dementia   . Dementia arising in the senium and presenium 02/22/2017  . HTN, goal below 150/90 02/22/2017   Losartan  . Hypertension   . Major  depression in remission (HCC) 02/22/2017  . Pulmonary embolism without acute cor pulmonale (HCC) 02/22/2017  . RA (rheumatoid arthritis) (HCC)    Past Surgical History:  Procedure Laterality Date  . JOINT REPLACEMENT     left hip replacement 09-19-16    Allergies  Allergen Reactions  . Morphine Sulfate   . Penicillins Other (See Comments)    Family thinks pt is allergic to penicillin but not sure     Allergies as of 02/07/2018      Reactions   Morphine Sulfate    Penicillins Other (See Comments)   Family thinks pt is allergic to penicillin but not sure       Medication List        Accurate as of 02/07/18 11:59 PM. Always use your most recent med list.          acetaminophen 325 MG tablet Commonly known as:  TYLENOL Take 650 mg by mouth 4 (four) times daily as needed. 8 am, 1 pm, 5 pm, 9 pm   apixaban 5 MG Tabs tablet Commonly known as:  ELIQUIS Take 1 tablet (5 mg total) by mouth 2 (two) times daily. Start after 10 mg tabs are finished   calcium-vitamin D 500-200 MG-UNIT tablet Commonly known as:  OSCAL WITH D Take 1 tablet by mouth 2 (two) times daily.   CELEXA 10 MG tablet Generic drug:  citalopram Take 5 mg by mouth daily. 1/2 tab   cyanocobalamin 500 MCG tablet Take 500  mcg by mouth daily.   donepezil 10 MG tablet Commonly known as:  ARICEPT Take 10 mg by mouth daily.   ENSURE ENLIVE PO Take 1 Bottle by mouth daily.   folic acid 1 MG tablet Commonly known as:  FOLVITE Take 1 mg by mouth daily.   hydrocortisone cream 1 % Apply 1 application topically daily as needed. For red flaky patches on face   hydrocortisone cream 1 % Apply 1 application topically 2 (two) times daily. for red flaky patches to BUE. Use cream until resolved, then may dc order   ketoconazole 2 % cream Commonly known as:  NIZORAL Apply 1 application topically daily. Apply thin film to affected areas of face and scalp 2-3 x weekly. For seborrheic dermatitis   losartan 50 MG  tablet Commonly known as:  COZAAR Take 50 mg by mouth daily. Reduced due to hypotension.   methotrexate 2.5 MG tablet Commonly known as:  RHEUMATREX Take 2.5 mg by mouth once a week. Caution:Chemotherapy. Protect from light.  Pt takes on Tuesday evenings... Was changed by MD- per pts daughter   PREVIDENT 5000 DRY MOUTH 1.1 % Gel dental gel Generic drug:  sodium fluoride Please brush regularly with prescribed toothpaste only per Dr. Eartha Inch DDS instruction on 7/10 during office visit. May keep in room       Review of Systems  Unable to perform ROS: Dementia  Constitutional: Negative for activity change, appetite change, chills, diaphoresis and fever.  HENT: Negative for trouble swallowing.   Respiratory: Negative for apnea, cough, choking, chest tightness, shortness of breath and wheezing.   Cardiovascular: Negative for chest pain, palpitations and leg swelling.  Gastrointestinal: Negative for abdominal pain, constipation, diarrhea and nausea.  Genitourinary: Negative.        Incontinent  Musculoskeletal: Positive for arthralgias (typical arthritis) and gait problem. Negative for back pain and myalgias.  Skin: Negative for color change, pallor, rash and wound.  Neurological: Positive for weakness. Negative for tremors.  Psychiatric/Behavioral: Negative for agitation and behavioral problems.  All other systems reviewed and are negative.   Immunization History  Administered Date(s) Administered  . Influenza-Unspecified 08/13/2016, 08/31/2017  . PPD Test 02/01/2017  . Pneumococcal-Unspecified 08/13/2016   Pertinent  Health Maintenance Due  Topic Date Due  . PNA vac Low Risk Adult (2 of 2 - PCV13) 08/13/2017  . INFLUENZA VACCINE  Completed   No flowsheet data found. Functional Status Survey:    Vitals:   02/07/18 1448  BP: 123/66  Pulse: 86  Resp: 20  Temp: 98.3 F (36.8 C)  TempSrc: Oral  SpO2: 96%  Weight: 189 lb 3.2 oz (85.8 kg)  Height: 5\' 7"   (1.702 m)   Body mass index is 29.63 kg/m. Physical Exam  Constitutional: Vital signs are normal. He appears well-developed and well-nourished. He is active and cooperative. He does not appear ill. No distress.  HENT:  Head: Normocephalic and atraumatic.  Mouth/Throat: Uvula is midline, oropharynx is clear and moist and mucous membranes are normal. Mucous membranes are not pale, not dry and not cyanotic.  Eyes: Pupils are equal, round, and reactive to light. Conjunctivae, EOM and lids are normal.  Neck: Trachea normal, normal range of motion and full passive range of motion without pain. Neck supple. No JVD present. No tracheal deviation, no edema and no erythema present. No thyromegaly present.  Cardiovascular: Normal rate, regular rhythm, normal heart sounds, intact distal pulses and normal pulses. Exam reveals no gallop, no distant heart sounds and no  friction rub.  No murmur heard. Pulses:      Dorsalis pedis pulses are 2+ on the right side, and 2+ on the left side.  No edema  Pulmonary/Chest: Effort normal and breath sounds normal. No accessory muscle usage. No respiratory distress. He has no decreased breath sounds. He has no wheezes. He has no rhonchi. He has no rales. He exhibits no tenderness.  Abdominal: Soft. Normal appearance and bowel sounds are normal. He exhibits no distension and no ascites. There is no tenderness.  Musculoskeletal: Normal range of motion. He exhibits no edema or tenderness.  Expected osteoarthritis, stiffness; Bilateral Calves soft, supple. Negative Homan's Sign. B- pedal pulses equal generalized weakness, mobile on unit in wheelchair  Neurological: He is alert. He has normal strength. A cranial nerve deficit and sensory deficit is present. He exhibits abnormal muscle tone. Coordination and gait abnormal.  Skin: Skin is warm, dry and intact. He is not diaphoretic. No cyanosis. No pallor. Nails show no clubbing.  Psychiatric: His speech is normal. Thought  content normal. He is agitated (at times) and slowed. Cognition and memory are impaired. He expresses impulsivity and inappropriate judgment. He exhibits a depressed mood. He exhibits abnormal recent memory and abnormal remote memory.  Nursing note and vitals reviewed.   Labs reviewed: Recent Labs    03/23/17 1400 10/20/17 0455 11/15/17 0443  NA 137 137 137  K 4.0 4.0 4.2  CL 105 105 105  CO2 23 24 23   GLUCOSE 111* 114* 102*  BUN 16 12 15   CREATININE 0.96 0.77 0.80  CALCIUM 8.8* 8.5* 8.4*  MG  --  1.9 1.8   Recent Labs    03/23/17 1400 10/20/17 0455 11/15/17 0443  AST 39 18 18  ALT 32 28 24  ALKPHOS 78 61 63  BILITOT 0.9 0.5 0.3  PROT 6.6 6.1* 6.0*  ALBUMIN 3.4* 3.0* 3.0*   Recent Labs    03/23/17 1400 10/20/17 0455 11/15/17 0443  WBC 10.1 5.1 4.8  NEUTROABS 9.7* 3.4 2.8  HGB 12.8* 13.7 14.0  HCT 39.1* 41.5 42.6  MCV 84.5 83.9 83.4  PLT 143* 161 161   Lab Results  Component Value Date   TSH 3.097 11/15/2017   No results found for: HGBA1C No results found for: CHOL, HDL, LDLCALC, LDLDIRECT, TRIG, CHOLHDL  Significant Diagnostic Results in last 30 days:  No results found.  Assessment/Plan Satoru was seen today for medical management of chronic issues.  Diagnoses and all orders for this visit:  Eczema, unspecified type  Rheumatoid arthritis involving both hands, unspecified rheumatoid factor presence (HCC)  Vitamin B12 deficiency   Above listed conditions stable  Continue current medication regimen  Continue restorative nursing program  Encourage participation in activities and interaction with other residents  Monitor closely for pain  Monitor skin integrity  Safety precautions  Fall risk  Family/ staff Communication:  Total Time:  Documentation:  Face to Face:  Family/Phone:   Labs/tests ordered: Not due  Medication list reviewed and assessed for continued appropriateness. Monthly medication orders reviewed and  signed.  14/03/2017, NP-C Geriatrics [facility] [phone] [address], [address] [place] Cell Phone (Mon-Fri 8am-5pm):  [phone] On Call:  [phone] & follow prompts after 5pm & weekends Office Phone:  [phone] Office Fax:  [phone]

## 2018-03-07 NOTE — Assessment & Plan Note (Signed)
Stable.  No current complaints of symptoms.  Patient continues on methotrexate 2.5 mg p.o. weekly on Tuesday evenings for suppression of symptoms.

## 2018-03-13 ENCOUNTER — Encounter
Admission: RE | Admit: 2018-03-13 | Discharge: 2018-03-13 | Disposition: A | Discharge status: Home / Self Care | Payer: Medicare Other | Source: Ambulatory Visit | Attending: Internal Medicine | Admitting: Internal Medicine

## 2018-04-06 ENCOUNTER — Encounter: Payer: Self-pay | Admitting: Gerontology

## 2018-04-06 ENCOUNTER — Non-Acute Institutional Stay (SKILLED_NURSING_FACILITY): Payer: Medicare Other | Admitting: Gerontology

## 2018-04-06 DIAGNOSIS — F0281 Dementia in other diseases classified elsewhere with behavioral disturbance: Secondary | ICD-10-CM

## 2018-04-06 DIAGNOSIS — I2699 Other pulmonary embolism without acute cor pulmonale: Secondary | ICD-10-CM | POA: Diagnosis not present

## 2018-04-06 DIAGNOSIS — F02818 Dementia in other diseases classified elsewhere, unspecified severity, with other behavioral disturbance: Secondary | ICD-10-CM

## 2018-04-06 DIAGNOSIS — G301 Alzheimer's disease with late onset: Secondary | ICD-10-CM

## 2018-04-06 DIAGNOSIS — I10 Essential (primary) hypertension: Secondary | ICD-10-CM | POA: Diagnosis not present
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
## 2018-04-06 NOTE — Assessment & Plan Note (Signed)
Stable.  However, recent reduction in blood pressure.  Asymptomatic.  Patient denies chest pain or shortness of breath.  Will DC losartan for now and monitor.
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
## 2018-04-06 NOTE — Assessment & Plan Note (Signed)
Stable.  Patient denies chest pain or shortness of breath.  On Eliquis 5 mg p.o. twice daily

## 2018-04-12 ENCOUNTER — Encounter
Admission: RE | Admit: 2018-04-12 | Discharge: 2018-04-12 | Disposition: A | Discharge status: Home / Self Care | Payer: Medicare Other | Source: Ambulatory Visit | Attending: Internal Medicine | Admitting: Internal Medicine

## 2018-04-26 ENCOUNTER — Non-Acute Institutional Stay (SKILLED_NURSING_FACILITY): Payer: Medicare Other | Admitting: Gerontology

## 2018-04-26 ENCOUNTER — Encounter: Payer: Self-pay | Admitting: Gerontology

## 2018-04-26 DIAGNOSIS — G301 Alzheimer's disease with late onset: Secondary | ICD-10-CM | POA: Diagnosis not present

## 2018-04-26 DIAGNOSIS — F0281 Dementia in other diseases classified elsewhere with behavioral disturbance: Secondary | ICD-10-CM | POA: Diagnosis not present

## 2018-04-26 DIAGNOSIS — F02818 Dementia in other diseases classified elsewhere, unspecified severity, with other behavioral disturbance: Secondary | ICD-10-CM
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
## 2018-05-03 ENCOUNTER — Non-Acute Institutional Stay (SKILLED_NURSING_FACILITY): Payer: Medicare Other | Admitting: Gerontology

## 2018-05-03 ENCOUNTER — Encounter: Payer: Self-pay | Admitting: Gerontology

## 2018-05-03 DIAGNOSIS — R918 Other nonspecific abnormal finding of lung field: Secondary | ICD-10-CM

## 2018-05-03 DIAGNOSIS — G301 Alzheimer's disease with late onset: Secondary | ICD-10-CM | POA: Diagnosis not present

## 2018-05-03 DIAGNOSIS — F0281 Dementia in other diseases classified elsewhere with behavioral disturbance: Secondary | ICD-10-CM

## 2018-05-03 DIAGNOSIS — E441 Mild protein-calorie malnutrition: Secondary | ICD-10-CM

## 2018-05-03 DIAGNOSIS — F02818 Dementia in other diseases classified elsewhere, unspecified severity, with other behavioral disturbance: Secondary | ICD-10-CM

## 2018-05-08 NOTE — Assessment & Plan Note (Signed)
Progressive. See below.

## 2018-05-08 NOTE — Progress Notes (Signed)
Location:    Nursing Home Room Number: 308A Place of Service:  SNF (31) Provider:  Toni Arthurs, NP-C  System, Pcp Not In  Patient Care Team: System, Pcp Not In as PCP - General  Extended Emergency Contact Information Primary Emergency Contact: [name],Joseph Address: [address]          [place], [place] [address] [place] Phone: [phone] Relation: Other  Code Status:  DNR Goals of care: Advanced Directive information Advanced Directives 05/03/2018  Does Patient Have a Medical Advance Directive? Yes  Type of Advance Directive Out of facility DNR (pink MOST or yellow form)  Does patient want to make changes to medical advance directive? No - Patient declined  Copy of [place] in Chart? -  Pre-existing out of facility DNR order (yellow form or pink MOST form) Yellow form placed in chart (order not valid for inpatient use)     Chief Complaint  Patient presents with  . Medical Management of Chronic Issues    Routine Visit    HPI:  Pt is a 84 y.o. male seen today for medical management of chronic diseases.    Protein-calorie malnutrition ([place]) Progressive. Pt typically consumes on average 75% of meals, but has had an 8 # weight loss in 2 months. Will check labs next week to assess albumin, protein, etc. Continues on Ensure Enlive daily.   Dementia associated with other underlying disease without behavioral disturbance Progressive. Pt now noted to have some weight loss. Decreased interaction with others. Sleeping more. GDR of Risperdal continues. Pt stable on Depakote. Also, on Aricept 10 mg daily.   Late onset Alzheimer's disease without behavioral disturbance Progressive. See below.  Other nonspecific abnormal finding of lung field Stable. No recent episodes of Pneumonia, etc. No further workup of abnormal finding  Please note pt with limited verbal/cognitive ability. Unable to obtain complete ROS. Some ROS info obtained from staff  and documentation.   Past Medical History:  Diagnosis Date  . Alzheimer's dementia   . Dementia arising in the senium and presenium 02/22/2017  . HTN, goal below 150/90 02/22/2017   Losartan  . Hypertension   . Major depression in remission ([place]) 02/22/2017  . Pulmonary embolism without acute cor pulmonale ([place]) 02/22/2017  . RA (rheumatoid arthritis) ([place])    Past Surgical History:  Procedure Laterality Date  . JOINT REPLACEMENT     left hip replacement 09-19-16    Allergies  Allergen Reactions  . Morphine Sulfate   . Penicillins Other (See Comments)    Family thinks pt is allergic to penicillin but not sure     Allergies as of 05/03/2018      Reactions   Morphine Sulfate    Penicillins Other (See Comments)   Family thinks pt is allergic to penicillin but not sure       Medication List        Accurate as of 05/03/18 11:59 PM. Always use your most recent med list.          acetaminophen 325 MG tablet Commonly known as:  TYLENOL Take 650 mg by mouth 4 (four) times daily as needed. 8 am, 1 pm, 5 pm, 9 pm   calcium-vitamin D 500-200 MG-UNIT tablet Commonly known as:  OSCAL WITH D Take 1 tablet by mouth 2 (two) times daily.   citalopram 10 MG tablet Commonly known as:  CELEXA Take 5 mg by mouth daily.   divalproex 125 MG capsule Commonly known as:  DEPAKOTE SPRINKLE Take 125 mg by mouth 2 (two) times daily.   donepezil 10 MG tablet Commonly known as:  ARICEPT Take 10 mg by mouth daily.   ELIQUIS 5 MG Tabs tablet Generic drug:  apixaban Take 5 mg by mouth 2 (two) times daily.   ENSURE ENLIVE PO Take 1 Bottle by mouth daily.   folic acid 1 MG tablet Commonly known as:  FOLVITE Take 1 mg by mouth daily.   hydrocortisone cream 1 % Apply 1 application topically daily as needed. For red flaky patches on face   hydrocortisone cream 1 % Apply 1 application topically 2 (two) times daily. for red flaky patches to BUE. Use cream until resolved, then may dc  order   ketoconazole 2 % cream Commonly known as:  NIZORAL Apply 1 application topically 3 (three) times a week. Once A Day on Mon, Wed, Fri    Apply thin film to affected areas of face for seborrheic dermatitis   methotrexate 2.5 MG tablet Commonly known as:  RHEUMATREX Take 2.5 mg by mouth once a week. Caution:Chemotherapy. Protect from light.  Pt takes on Tuesday evenings... Was changed by MD- per pts daughter   PREVIDENT 5000 DRY MOUTH 1.1 % Gel dental gel Generic drug:  sodium fluoride Please brush regularly with prescribed toothpaste only per Dr. Dana Allan DDS instruction on 7/10 during office visit. May keep in room   risperiDONE 0.25 MG tablet Commonly known as:  RISPERDAL Take 0.25 mg by mouth 2 (two) times daily.   vitamin B-12 500 MCG tablet Commonly known as:  CYANOCOBALAMIN Take 500 mcg by mouth daily.       Review of Systems  Unable to perform ROS: Dementia  Constitutional: Positive for fatigue and unexpected weight change. Negative for activity change, appetite change, chills, diaphoresis and fever.  HENT: Negative for congestion, mouth sores, nosebleeds, postnasal drip, sneezing, sore throat, trouble swallowing and voice change.   Respiratory: Negative for apnea, cough, choking, chest tightness, shortness of breath and wheezing.   Cardiovascular: Negative for chest pain, palpitations and leg swelling.  Gastrointestinal: Negative for abdominal distention, abdominal pain, constipation, diarrhea and nausea.  Genitourinary: Negative for difficulty urinating, dysuria, frequency and urgency.  Musculoskeletal: Negative for back pain, gait problem and myalgias. Arthralgias: typical arthritis.  Skin: Negative for color change, pallor, rash and wound.  Neurological: Positive for weakness. Negative for dizziness, tremors, syncope, speech difficulty, numbness and headaches.  Psychiatric/Behavioral: Positive for behavioral problems (at times), confusion and  dysphoric mood. Negative for agitation.  All other systems reviewed and are negative.   Immunization History  Administered Date(s) Administered  . Influenza-Unspecified 08/13/2016, 08/31/2017  . PPD Test 02/01/2017  . Pneumococcal-Unspecified 08/13/2016   Pertinent  Health Maintenance Due  Topic Date Due  . PNA vac Low Risk Adult (2 of 2 - PCV13) 08/13/2017  . INFLUENZA VACCINE  07/13/2018   No flowsheet data found. Functional Status Survey:    Vitals:   05/03/18 0941  BP: 121/70  Pulse: 73  Resp: 20  Temp: (!) 97.4 F (36.3 C)  TempSrc: Oral  SpO2: 98%  Weight: 170 lb 6.4 oz (77.3 kg)  Height: 5' 7"  (1.702 m)   Body mass index is 26.69 kg/m. Physical Exam  Constitutional: Vital signs are normal. He appears well-developed and well-nourished. He appears lethargic. He is active and cooperative. He does not appear ill. No distress.  HENT:  Head: Normocephalic and atraumatic.  Mouth/Throat: Uvula is midline, oropharynx is clear and moist and  mucous membranes are normal. Mucous membranes are not pale, not dry and not cyanotic.  Eyes: Pupils are equal, round, and reactive to light. Conjunctivae, EOM and lids are normal.  Neck: Trachea normal, normal range of motion and full passive range of motion without pain. Neck supple. No JVD present. No tracheal deviation, no edema and no erythema present. No thyromegaly present.  Cardiovascular: Normal rate, regular rhythm, normal heart sounds, intact distal pulses and normal pulses. Exam reveals no gallop, no distant heart sounds and no friction rub.  No murmur heard. Pulses:      Dorsalis pedis pulses are 2+ on the right side, and 2+ on the left side.  No edema  Pulmonary/Chest: Effort normal and breath sounds normal. No accessory muscle usage. No respiratory distress. He has no decreased breath sounds. He has no wheezes. He has no rhonchi. He has no rales. He exhibits no tenderness.  Abdominal: Soft. Normal appearance and bowel  sounds are normal. He exhibits no distension and no ascites. There is no tenderness.  Musculoskeletal: Normal range of motion. He exhibits no edema or tenderness.  Expected osteoarthritis, stiffness; Bilateral Calves soft, supple. Negative Homan's Sign. B- pedal pulses equal; mobile in wheelchair  Neurological: He has normal strength. He appears lethargic. He displays atrophy. He exhibits abnormal muscle tone. Coordination and gait abnormal.  Participates in Restorative Nursing Program  Skin: Skin is warm, dry and intact. He is not diaphoretic. No cyanosis. No pallor. Nails show no clubbing.  Psychiatric: His speech is normal. Thought content normal. His affect is blunt. He is aggressive (at times), slowed and withdrawn. Cognition and memory are impaired. He expresses impulsivity and inappropriate judgment. He exhibits abnormal recent memory.  Nursing note and vitals reviewed.   Labs reviewed: Recent Labs    10/20/17 0455 11/15/17 0443  NA 137 137  K 4.0 4.2  CL 105 105  CO2 24 23  GLUCOSE 114* 102*  BUN 12 15  CREATININE 0.77 0.80  CALCIUM 8.5* 8.4*  MG 1.9 1.8   Recent Labs    10/20/17 0455 11/15/17 0443  AST 18 18  ALT 28 24  ALKPHOS 61 63  BILITOT 0.5 0.3  PROT 6.1* 6.0*  ALBUMIN 3.0* 3.0*   Recent Labs    10/20/17 0455 11/15/17 0443  WBC 5.1 4.8  NEUTROABS 3.4 2.8  HGB 13.7 14.0  HCT 41.5 42.6  MCV 83.9 83.4  PLT 161 161   Lab Results  Component Value Date   TSH 3.097 11/15/2017   No results found for: HGBA1C No results found for: CHOL, HDL, LDLCALC, LDLDIRECT, TRIG, CHOLHDL  Significant Diagnostic Results in last 30 days:  No results found.  Assessment/Plan Joseph [name] was seen today for medical management of chronic issues.  Diagnoses and all orders for this visit:  Late onset Alzheimer's disease with behavioral disturbance  Dementia associated with other underlying disease with behavioral disturbance  Mild protein-calorie malnutrition ([place])  Other  nonspecific abnormal finding of lung field   Above listed conditions progressive, except abnormal lung finding  Continue current medication regimen, except  Decrease Risperdal to 0.25 mg po Q HS x 1 week, then DC  Add Beneprotein powder supplement daily   Labs next week  Continue to encourage po food and fluid intake  Continue to encourage interaction with other residents and participation in (approved) activities  Safety precautions  Fall precautions  Redirect as needed  Consider Palliative Care Consult if family agreeable  Family/ staff Communication:   Total Time:  Documentation:  Face to Face:  Family/Phone:   Labs/tests ordered:  Cbc, met c, mag+, TSH, B12, D, lipid panel  Medication list reviewed and assessed for continued appropriateness. Monthly medication orders reviewed and signed.  Vikki Ports, NP-C Geriatrics [facility] [phone] [address], [place] [address] Cell Phone (Mon-Fri 8am-5pm):  [phone] On Call:  [phone] & follow prompts after 5pm & weekends Office Phone:  [phone] Office Fax:  [phone]

## 2018-05-08 NOTE — Assessment & Plan Note (Signed)
Stable. No recent episodes of Pneumonia, etc. No further workup of abnormal finding

## 2018-05-08 NOTE — Assessment & Plan Note (Signed)
Progressive. Pt now noted to have some weight loss. Decreased interaction with others. Sleeping more. GDR of Risperdal continues. Pt stable on Depakote. Also, on Aricept 10 mg daily.

## 2018-05-08 NOTE — Assessment & Plan Note (Addendum)
Progressive. Pt typically consumes on average 75% of meals, but has had an 8 # weight loss in 2 months. Will check labs next week to assess albumin, protein, etc. Continues on Ensure Enlive daily.

## 2018-05-13 ENCOUNTER — Encounter
Admission: RE | Admit: 2018-05-13 | Discharge: 2018-05-13 | Disposition: A | Discharge status: Home / Self Care | Payer: Medicare Other | Source: Ambulatory Visit | Attending: Internal Medicine | Admitting: Internal Medicine

## 2018-05-23 ENCOUNTER — Other Ambulatory Visit
Admission: RE | Admit: 2018-05-23 | Discharge: 2018-05-23 | Disposition: A | Discharge status: Home / Self Care | Payer: Medicare Other | Source: Ambulatory Visit | Attending: Gerontology | Admitting: Gerontology

## 2018-05-23 DIAGNOSIS — F0281 Dementia in other diseases classified elsewhere with behavioral disturbance: Secondary | ICD-10-CM | POA: Insufficient documentation

## 2018-05-23 LAB — LIPID PANEL
CHOL/HDL RATIO: 4.5 ratio
Cholesterol: 174 mg/dL (ref 0–200)
HDL: 39 mg/dL — AB (ref >40)
LDL Cholesterol: 110 mg/dL — ABNORMAL HIGH (ref 0–99)
TRIGLYCERIDES: 124 mg/dL (ref <150)
VLDL: 25 mg/dL (ref 0–40)

## 2018-05-23 LAB — CBC WITH DIFFERENTIAL/PLATELET
BASOS ABS: 0 10*3/uL (ref 0–0.1)
BASOS ABS: 0.1 10*3/uL (ref 0–0.1)
Basophils Relative: 1 %
Basophils Relative: 1 %
EOS PCT: 4 %
Eosinophils Absolute: 0.2 10*3/uL (ref 0–0.7)
Eosinophils Absolute: 0.1 10*3/uL (ref 0–0.7)
Eosinophils Relative: 2 %
HCT: 46.4 % (ref 40.0–52.0)
HEMATOCRIT: 40.9 % (ref 40.0–52.0)
Hemoglobin: 13.7 g/dL (ref 13.0–18.0)
Hemoglobin: 15.6 g/dL (ref 13.0–18.0)
LYMPHS ABS: 1.1 10*3/uL (ref 1.0–3.6)
LYMPHS PCT: 22 %
LYMPHS PCT: 21 %
Lymphs Abs: 1.4 10*3/uL (ref 1.0–3.6)
MCH: 28.1 pg (ref 26.0–34.0)
MCH: 28.4 pg (ref 26.0–34.0)
MCHC: 33.6 g/dL (ref 32.0–36.0)
MCHC: 33.5 g/dL (ref 32.0–36.0)
MCV: 83.7 fL (ref 80.0–100.0)
MCV: 84.9 fL (ref 80.0–100.0)
MONO ABS: 0.6 10*3/uL (ref 0.2–1.0)
Monocytes Absolute: 0.7 10*3/uL (ref 0.2–1.0)
Monocytes Relative: 12 %
Monocytes Relative: 10 %
NEUTROS ABS: 3.1 10*3/uL (ref 1.4–6.5)
NEUTROS ABS: 4.2 10*3/uL (ref 1.4–6.5)
NEUTROS PCT: 66 %
Neutrophils Relative %: 61 %
PLATELETS: 152 10*3/uL (ref 150–440)
Platelets: 169 10*3/uL (ref 150–440)
RBC: 4.88 MIL/uL (ref 4.40–5.90)
RBC: 5.47 MIL/uL (ref 4.40–5.90)
RDW: 14.2 % (ref 11.5–14.5)
RDW: 14.2 % (ref 11.5–14.5)
WBC: 5.1 10*3/uL (ref 3.8–10.6)
WBC: 6.5 10*3/uL (ref 3.8–10.6)

## 2018-05-23 LAB — COMPREHENSIVE METABOLIC PANEL
ALT: 26 U/L (ref 17–63)
AST: 22 U/L (ref 15–41)
Albumin: 3.8 g/dL (ref 3.5–5.0)
Alkaline Phosphatase: 64 U/L (ref 38–126)
Anion gap: 10 (ref 5–15)
BILIRUBIN TOTAL: 0.4 mg/dL (ref 0.3–1.2)
BUN: 16 mg/dL (ref 6–20)
CHLORIDE: 103 mmol/L (ref 101–111)
CO2: 25 mmol/L (ref 22–32)
CREATININE: 0.87 mg/dL (ref 0.61–1.24)
Calcium: 8.9 mg/dL (ref 8.9–10.3)
Glucose, Bld: 111 mg/dL — ABNORMAL HIGH (ref 65–99)
Potassium: 3.8 mmol/L (ref 3.5–5.1)
Sodium: 138 mmol/L (ref 135–145)
TOTAL PROTEIN: 7.4 g/dL (ref 6.5–8.1)

## 2018-05-23 LAB — TSH: TSH: 4.508 u[IU]/mL — AB (ref 0.350–4.500)

## 2018-05-23 LAB — MAGNESIUM: MAGNESIUM: 2.1 mg/dL (ref 1.7–2.4)

## 2018-05-23 LAB — VITAMIN B12: VITAMIN B 12: 1191 pg/mL — AB (ref 180–914)

## 2018-05-24 LAB — VITAMIN D 25 HYDROXY (VIT D DEFICIENCY, FRACTURES): Vit D, 25-Hydroxy: 41.2 ng/mL (ref 30.0–100.0)

## 2018-06-05 ENCOUNTER — Ambulatory Visit (INDEPENDENT_AMBULATORY_CARE_PROVIDER_SITE_OTHER): Payer: Medicare Other | Admitting: Podiatry

## 2018-06-05 ENCOUNTER — Encounter: Payer: Self-pay | Admitting: Podiatry

## 2018-06-05 DIAGNOSIS — B351 Tinea unguium: Secondary | ICD-10-CM | POA: Diagnosis not present

## 2018-06-05 DIAGNOSIS — M79676 Pain in unspecified toe(s): Secondary | ICD-10-CM

## 2018-06-05 DIAGNOSIS — D689 Coagulation defect, unspecified: Secondary | ICD-10-CM

## 2018-06-05 NOTE — Progress Notes (Signed)
Complaint:  Visit Type: Patient returns to my office for continued preventative foot care services. Complaint: Patient states" my nails have grown long and thick and become painful to walk and wear shoes" The patient presents for preventative foot care services. No changes to ROS.  Patient is taking eliquiss. Patient is with his daughter.  Podiatric Exam: Vascular: dorsalis pedis and posterior tibial pulses are palpable bilateral. Capillary return is immediate. Temperature gradient is WNL. Skin turgor WNL  Sensorium: Normal Semmes Weinstein monofilament test. Normal tactile sensation bilaterally. Nail Exam: Pt has thick disfigured discolored nails with subungual debris noted bilateral entire nail hallux . Ulcer Exam: There is no evidence of ulcer or pre-ulcerative changes or infection. Orthopedic Exam: Muscle tone and strength are WNL. No limitations in general ROM. No crepitus or effusions noted. Foot type and digits show no abnormalities. Bony prominences are unremarkable. Skin: No Porokeratosis. No infection or ulcers  Diagnosis:  Onychomycosis, , Pain in right toe, pain in left toes  Treatment & Plan Procedures and Treatment: Consent by patient was obtained for treatment procedures.   Debridement of mycotic and hypertrophic toenails, 1 through 5 bilateral and clearing of subungual debris. No ulceration, no infection noted.  Return Visit-Office Procedure: Patient instructed to return to the office for a follow up visit 3 months for continued evaluation and treatment.    Helane Gunther DPM

## 2018-06-08 ENCOUNTER — Encounter: Payer: Self-pay | Admitting: Gerontology

## 2018-06-12 ENCOUNTER — Encounter
Admission: RE | Admit: 2018-06-12 | Discharge: 2018-06-12 | Disposition: A | Discharge status: Home / Self Care | Payer: Medicare Other | Source: Ambulatory Visit | Attending: Internal Medicine | Admitting: Internal Medicine

## 2018-06-20 ENCOUNTER — Encounter: Payer: Self-pay | Admitting: Adult Health

## 2018-06-20 ENCOUNTER — Non-Acute Institutional Stay (SKILLED_NURSING_FACILITY): Payer: Medicare Other | Admitting: Adult Health

## 2018-06-20 DIAGNOSIS — F0391 Unspecified dementia with behavioral disturbance: Secondary | ICD-10-CM

## 2018-06-20 DIAGNOSIS — F329 Major depressive disorder, single episode, unspecified: Secondary | ICD-10-CM

## 2018-06-20 DIAGNOSIS — I2699 Other pulmonary embolism without acute cor pulmonale: Secondary | ICD-10-CM

## 2018-06-20 DIAGNOSIS — F39 Unspecified mood [affective] disorder: Secondary | ICD-10-CM

## 2018-06-20 DIAGNOSIS — M069 Rheumatoid arthritis, unspecified: Secondary | ICD-10-CM | POA: Diagnosis not present

## 2018-06-20 NOTE — Progress Notes (Signed)
Opened in error; Disregard.

## 2018-06-20 NOTE — Progress Notes (Signed)
[facility]:  [facility] Room Number: 308A Place of Service:  SNF (31) Provider:  Kenard Gower, NP  Patient Care Team: System, Pcp Not In as PCP - General  Extended Emergency Contact Information Primary Emergency Contact: Caviness,Carol Address: [address]          [place], [place] [address] [place] Home Phone: [phone] Relation: Other  Code Status:  DNR  Goals of care: Advanced Directive information Advanced Directives 06/20/2018  Does Patient Have a Medical Advance Directive? Yes  Type of Advance Directive Out of facility DNR (pink MOST or yellow form)  Does patient want to make changes to medical advance directive? No - Patient declined  Copy of Healthcare Power of Attorney in Chart? -  Pre-existing out of facility DNR order (yellow form or pink MOST form) Yellow form placed in chart (order not valid for inpatient use)     Chief Complaint  Patient presents with  . Medical Management of Chronic Issues    Routine Visit    HPI:  Pt is a 84 y.o. male seen today for medical management of chronic diseases. He has PMH of dementia, hypertension, major depression and pulmonary embolism. He was seen today watching television by the nurses station. He was brought to his room. He did not complain of any pain. No reported SOB.    Past Medical History:  Diagnosis Date  . Alzheimer's dementia   . Dementia arising in the senium and presenium 02/22/2017  . HTN, goal below 150/90 02/22/2017   Losartan  . Hypertension   . Major depression in remission (HCC) 02/22/2017  . Pulmonary embolism without acute cor pulmonale (HCC) 02/22/2017  . RA (rheumatoid arthritis) (HCC)    Past Surgical History:  Procedure Laterality Date  . JOINT REPLACEMENT     left hip replacement 09-19-16    Allergies  Allergen Reactions  . Morphine Sulfate   . Penicillins Other (See Comments)    Family thinks pt is allergic to penicillin but not sure      Outpatient Encounter Medications as of 06/20/2018  Medication Sig  . acetaminophen (TYLENOL) 325 MG tablet Take 650 mg by mouth 4 (four) times daily as needed. 8 am, 1 pm, 5 pm, 9 pm   . apixaban (ELIQUIS) 5 MG TABS tablet Take 5 mg by mouth 2 (two) times daily.  . calcium-vitamin D (OSCAL WITH D) 500-200 MG-UNIT tablet Take 1 tablet by mouth 2 (two) times daily.  . citalopram (CELEXA) 10 MG tablet Take 5 mg by mouth daily.  . cyanocobalamin 500 MCG tablet Take 500 mcg by mouth daily.  . divalproex (DEPAKOTE SPRINKLE) 125 MG capsule Take 125 mg by mouth 2 (two) times daily.   Marland Kitchen donepezil (ARICEPT) 10 MG tablet Take 10 mg by mouth daily.   . folic acid (FOLVITE) 1 MG tablet Take 1 mg by mouth daily.  . hydrocortisone cream 1 % Apply 1 application topically daily as needed. For red flaky patches on face  . hydrocortisone cream 1 % Apply 1 application topically 2 (two) times daily. for red flaky patches to BUE. Use cream until resolved, then may dc order  . ketoconazole (NIZORAL) 2 % cream Apply 1 application topically 3 (three) times a week. Once A Day on Mon, Wed, Fri    Apply thin film to affected areas of face for seborrheic dermatitis  . methotrexate (RHEUMATREX) 2.5 MG tablet Take 2.5 mg by mouth once a week. Caution:Chemotherapy. Protect from light.  Pt takes on Tuesday evenings... Was changed by MD- per pts daughter  . Nutritional Supplements (ENSURE ENLIVE PO) Take 1 Bottle by mouth daily.   . sodium fluoride (PREVIDENT 5000 DRY MOUTH) 1.1 % GEL dental gel Please brush regularly with prescribed toothpaste only per Dr. Eartha Inch DDS instruction on 7/10 during office visit. May keep in room  . [DISCONTINUED] risperiDONE (RISPERDAL) 0.25 MG tablet Take 0.25 mg by mouth 2 (two) times daily.   No facility-administered encounter medications on file as of 06/20/2018.     Review of Systems  Unable to obtain due to dementia    Immunization History  Administered Date(s)  Administered  . Influenza-Unspecified 08/13/2016, 08/31/2017  . PPD Test 02/01/2017, 01/13/2018  . Pneumococcal-Unspecified 08/13/2016   Pertinent  Health Maintenance Due  Topic Date Due  . PNA vac Low Risk Adult (2 of 2 - PCV13) 08/13/2017  . INFLUENZA VACCINE  07/13/2018   No flowsheet data found. Functional Status Survey:    Vitals:   06/20/18 1100  BP: 110/87  Pulse: 80  Resp: 20  Temp: 97.6 F (36.4 C)  TempSrc: Oral  SpO2: 96%  Weight: 175 lb 3.2 oz (79.5 kg)  Height: 5\' 7"  (1.702 m)   Body mass index is 27.44 kg/m.  Physical Exam  GENERAL APPEARANCE: Well nourished. In no acute distress. Normal body habitus SKIN:  Skin is warm and dry. No bruising.  MOUTH and THROAT: Lips are without lesions. Oral mucosa is moist and without lesions. Tongue is normal in shape, size, and color and without lesions RESPIRATORY: Breathing is even & unlabored, BS CTAB CARDIAC: RRR, no murmur,no extra heart sounds, no edema GI: Abdomen soft, normal BS, no masses, no tenderness PSYCHIATRIC: Disoriented X 3. Affect and behavior are appropriate   Labs reviewed: Recent Labs    10/20/17 0455 11/15/17 0443 05/23/18 1330  NA 137 137 138  K 4.0 4.2 3.8  CL 105 105 103  CO2 24 23 25   GLUCOSE 114* 102* 111*  BUN 12 15 16   CREATININE 0.77 0.80 0.87  CALCIUM 8.5* 8.4* 8.9  MG 1.9 1.8 2.1   Recent Labs    10/20/17 0455 11/15/17 0443 05/23/18 1330  AST 18 18 22   ALT 28 24 26   ALKPHOS 61 63 64  BILITOT 0.5 0.3 0.4  PROT 6.1* 6.0* 7.4  ALBUMIN 3.0* 3.0* 3.8   Recent Labs    11/15/17 0443 05/23/18 0403 05/23/18 1330  WBC 4.8 5.1 6.5  NEUTROABS 2.8 3.1 4.2  HGB 14.0 13.7 15.6  HCT 42.6 40.9 46.4  MCV 83.4 83.7 84.9  PLT 161 152 169   Lab Results  Component Value Date   TSH 4.508 (H) 05/23/2018   No results found for: HGBA1C Lab Results  Component Value Date   CHOL 174 05/23/2018   HDL 39 (L) 05/23/2018   LDLCALC 110 (H) 05/23/2018   TRIG 124 05/23/2018    CHOLHDL 4.5 05/23/2018     Assessment/Plan . 1. Other pulmonary embolism without acute cor pulmonale, unspecified chronicity (HCC) -  No SOB, continue Eliquis 5 mg BID   2. Rheumatoid arthritis involving both hands, unspecified rheumatoid factor presence (HCC) - did not verbalize any joint pains, continue Methotrexate 2.5 mg daily   3. Major depressive disorder with single episode, remission status unspecified - stable, continue Citalopram 5 mg daily   4. Mood disorder (HCC) - mood is stable, continue Depakote sprinkles 125 mg BID   5. Dementia with behavioral disturbance, unspecified dementia  type - continue Donepezil 10 mg daily, supportive care, fall precautions    Family/ staff Communication: Discussed plan of care with patient.  Labs/tests ordered:  None  Goals of care:  Long-term care   Kenard Gower, NP [facility] and Adult Medicine [phone] (Monday-Friday 8:00 a.m. - 5:00 p.m.) [phone] (after hours)

## 2018-07-13 ENCOUNTER — Encounter
Admission: RE | Admit: 2018-07-13 | Discharge: 2018-07-13 | Disposition: A | Discharge status: Home / Self Care | Payer: Medicare Other | Source: Ambulatory Visit | Attending: Internal Medicine | Admitting: Internal Medicine

## 2018-07-25 ENCOUNTER — Encounter: Payer: Self-pay | Admitting: Adult Health

## 2018-07-25 ENCOUNTER — Non-Acute Institutional Stay (SKILLED_NURSING_FACILITY): Payer: Medicare Other | Admitting: Adult Health

## 2018-07-25 DIAGNOSIS — E538 Deficiency of other specified B group vitamins: Secondary | ICD-10-CM

## 2018-07-25 DIAGNOSIS — F329 Major depressive disorder, single episode, unspecified: Secondary | ICD-10-CM

## 2018-07-25 DIAGNOSIS — Z86711 Personal history of pulmonary embolism: Secondary | ICD-10-CM

## 2018-07-25 DIAGNOSIS — M069 Rheumatoid arthritis, unspecified: Secondary | ICD-10-CM

## 2018-07-25 DIAGNOSIS — F39 Unspecified mood [affective] disorder: Secondary | ICD-10-CM

## 2018-07-25 DIAGNOSIS — F02818 Dementia in other diseases classified elsewhere, unspecified severity, with other behavioral disturbance: Secondary | ICD-10-CM

## 2018-07-25 DIAGNOSIS — F0281 Dementia in other diseases classified elsewhere with behavioral disturbance: Secondary | ICD-10-CM

## 2018-07-25 NOTE — Progress Notes (Signed)
Location:  [facility] Room Number: 308A Place of Service:  SNF (31) Provider:  Kenard Gower, NP  Patient Care Team: System, Pcp Not In as PCP - General  Extended Emergency Contact Information Primary Emergency Contact: Caviness,Carol Address: [address]          [place], [place] [address] [place] Home Phone: [phone] Relation: Other  Code Status:  DNR  Goals of care: Advanced Directive information Advanced Directives 07/25/2018  Does Patient Have a Medical Advance Directive? Yes  Type of Advance Directive Out of facility DNR (pink MOST or yellow form)  Does patient want to make changes to medical advance directive? No - Patient declined  Copy of Healthcare Power of Attorney in Chart? -  Pre-existing out of facility DNR order (yellow form or pink MOST form) Yellow form placed in chart (order not valid for inpatient use)     Chief Complaint  Patient presents with  . Medical Management of Chronic Issues    Routine Visit    HPI:  Pt is an 84 y.o. male seen today for medical management of chronic diseases. He is a long-term care resident of [facility].  He has a PMH of dementia, chronic pulmonary embolism, hypertension, and rheumatoid arthritis. He responded to queries with "Yah, yah, yah". Earlier he was seen by near the exit door.   Past Medical History:  Diagnosis Date  . Alzheimer's dementia   . Dementia arising in the senium and presenium 02/22/2017  . HTN, goal below 150/90 02/22/2017   Losartan  . Hypertension   . Major depression in remission (HCC) 02/22/2017  . Pulmonary embolism without acute cor pulmonale (HCC) 02/22/2017  . RA (rheumatoid arthritis) (HCC)    Past Surgical History:  Procedure Laterality Date  . JOINT REPLACEMENT     left hip replacement 09-19-16    Allergies  Allergen Reactions  . Morphine Sulfate   . Penicillins Other (See Comments)    Family thinks pt is allergic to penicillin  but not sure     Outpatient Encounter Medications as of 07/25/2018  Medication Sig  . acetaminophen (TYLENOL) 325 MG tablet Take 650 mg by mouth 4 (four) times daily as needed. 8 am, 1 pm, 5 pm, 9 pm   . apixaban (ELIQUIS) 5 MG TABS tablet Take 5 mg by mouth 2 (two) times daily.  . calcium-vitamin D (OSCAL WITH D) 500-200 MG-UNIT tablet Take 1 tablet by mouth 2 (two) times daily.  . citalopram (CELEXA) 10 MG tablet Take 5 mg by mouth daily.  . cyanocobalamin 500 MCG tablet Take 500 mcg by mouth daily.  . divalproex (DEPAKOTE SPRINKLE) 125 MG capsule Take 125 mg by mouth 2 (two) times daily.   Marland Kitchen donepezil (ARICEPT) 10 MG tablet Take 10 mg by mouth daily.   . folic acid (FOLVITE) 1 MG tablet Take 1 mg by mouth daily.  . hydrocortisone cream 1 % Apply 1 application topically daily as needed. For red flaky patches on face  . hydrocortisone cream 1 % Apply 1 application topically 2 (two) times daily. for red flaky patches to BUE. Use cream until resolved, then may dc order  . ketoconazole (NIZORAL) 2 % cream Apply 1 application topically 3 (three) times a week. Once A Day on Mon, Wed, Fri    Apply thin film to affected areas of face for seborrheic dermatitis  . methotrexate (RHEUMATREX) 2.5 MG tablet Take 2.5 mg by mouth once a week. Caution:Chemotherapy. Protect  from light.  Pt takes on Tuesday evenings... Was changed by MD- per pts daughter  . Nutritional Supplements (ENSURE ENLIVE PO) Take 1 Bottle by mouth daily.   . sodium fluoride (PREVIDENT 5000 DRY MOUTH) 1.1 % GEL dental gel Please brush regularly with prescribed toothpaste only per Dr. Eartha Inch DDS instruction on 7/10 during office visit. May keep in room   No facility-administered encounter medications on file as of 07/25/2018.     Review of Systems Unable to obtain due to dementia    Immunization History  Administered Date(s) Administered  . Influenza-Unspecified 08/13/2016, 08/31/2017  . PPD Test 02/01/2017,  01/13/2018  . Pneumococcal-Unspecified 08/13/2016   Pertinent  Health Maintenance Due  Topic Date Due  . INFLUENZA VACCINE  08/13/2018 (Originally 07/13/2018)  . PNA vac Low Risk Adult (2 of 2 - PCV13) 07/25/2024 (Originally 08/13/2017)    Vitals:   07/25/18 1409  BP: (!) 145/82  Pulse: 77  Resp: 16  Temp: 97.6 F (36.4 C)  TempSrc: Oral  SpO2: 97%  Weight: 177 lb 14.4 oz (80.7 kg)  Height: 5\' 7"  (1.702 m)   Body mass index is 27.86 kg/m.  Physical Exam  GENERAL APPEARANCE: Well nourished. In no acute distress. Normal body habitus SKIN:  Skin is warm and dry.  MOUTH and THROAT: Lips are without lesions. Oral mucosa is moist and without lesions.  RESPIRATORY: Breathing is even & unlabored, BS CTAB CARDIAC: RRR, no murmur,no extra heart sounds, no edema GI: Abdomen soft, normal BS, no masses, no tenderness EXTREMITIES: Able to move X 4 extremities PSYCHIATRIC:  Affect and behavior are appropriate   Labs reviewed: Recent Labs    10/20/17 0455 11/15/17 0443 05/23/18 1330  NA 137 137 138  K 4.0 4.2 3.8  CL 105 105 103  CO2 24 23 25   GLUCOSE 114* 102* 111*  BUN 12 15 16   CREATININE 0.77 0.80 0.87  CALCIUM 8.5* 8.4* 8.9  MG 1.9 1.8 2.1   Recent Labs    10/20/17 0455 11/15/17 0443 05/23/18 1330  AST 18 18 22   ALT 28 24 26   ALKPHOS 61 63 64  BILITOT 0.5 0.3 0.4  PROT 6.1* 6.0* 7.4  ALBUMIN 3.0* 3.0* 3.8   Recent Labs    11/15/17 0443 05/23/18 0403 05/23/18 1330  WBC 4.8 5.1 6.5  NEUTROABS 2.8 3.1 4.2  HGB 14.0 13.7 15.6  HCT 42.6 40.9 46.4  MCV 83.4 83.7 84.9  PLT 161 152 169   Lab Results  Component Value Date   TSH 4.508 (H) 05/23/2018    Lab Results  Component Value Date   CHOL 174 05/23/2018   HDL 39 (L) 05/23/2018   LDLCALC 110 (H) 05/23/2018   TRIG 124 05/23/2018   CHOLHDL 4.5 05/23/2018    Assessment/Plan  1. Rheumatoid arthritis involving both hands, unspecified rheumatoid factor presence (HCC) - stable, continue methotrexate 2.5  mg 1 tab daily   2. History of pulmonary embolism - no SOB, continue Eliquis 5 mg 1 tab twice a day   3. Major depressive disorder with single episode, remission status unspecified - mood is stable, continue citalopram 5 mg 1 tab daily   4. Vitamin B12 deficiency - continue vitamin B12 500 g 1 tab daily Lab Results  Component Value Date   VITAMINB12 1,191 (H) 05/23/2018     5. Mood disorder (HCC) - has been trying to get out and reported  to be by the door a lot, continue Depakote sprinkles 125 mg 1  capsule twice a day   6. Dementia associated with other underlying disease with behavioral disturbance - continue donepezil 10 mg 1 tab daily    Family/ staff Communication: Discussed plan of care with charge nurse.  Labs/tests ordered:  None  Goals of care:   Long-term care.   Kenard Gower, NP [facility] and Adult Medicine [phone] (Monday-Friday 8:00 a.m. - 5:00 p.m.) [phone] (after hours)

## 2018-08-11 ENCOUNTER — Encounter: Payer: Self-pay | Admitting: Adult Health

## 2018-08-11 ENCOUNTER — Non-Acute Institutional Stay (SKILLED_NURSING_FACILITY): Payer: Medicare Other | Admitting: Adult Health

## 2018-08-11 DIAGNOSIS — F39 Unspecified mood [affective] disorder: Secondary | ICD-10-CM
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
## 2018-08-13 ENCOUNTER — Encounter
Admission: RE | Admit: 2018-08-13 | Discharge: 2018-08-13 | Disposition: A | Discharge status: Home / Self Care | Payer: Medicare Other | Source: Ambulatory Visit | Attending: Internal Medicine | Admitting: Internal Medicine

## 2018-08-22 ENCOUNTER — Non-Acute Institutional Stay (SKILLED_NURSING_FACILITY): Payer: Medicare Other | Admitting: Adult Health

## 2018-08-22 ENCOUNTER — Encounter: Payer: Self-pay | Admitting: Adult Health

## 2018-08-22 DIAGNOSIS — F329 Major depressive disorder, single episode, unspecified: Secondary | ICD-10-CM

## 2018-08-22 DIAGNOSIS — E538 Deficiency of other specified B group vitamins: Secondary | ICD-10-CM | POA: Diagnosis not present

## 2018-08-22 DIAGNOSIS — Z86711 Personal history of pulmonary embolism: Secondary | ICD-10-CM | POA: Diagnosis not present

## 2018-08-22 DIAGNOSIS — F39 Unspecified mood [affective] disorder: Secondary | ICD-10-CM | POA: Diagnosis not present

## 2018-08-22 DIAGNOSIS — F0280 Dementia in other diseases classified elsewhere without behavioral disturbance: Secondary | ICD-10-CM
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
## 2018-09-04 ENCOUNTER — Encounter: Payer: Self-pay | Admitting: Podiatry

## 2018-09-04 ENCOUNTER — Ambulatory Visit (INDEPENDENT_AMBULATORY_CARE_PROVIDER_SITE_OTHER): Payer: Medicare Other | Admitting: Podiatry

## 2018-09-04 DIAGNOSIS — B351 Tinea unguium: Secondary | ICD-10-CM | POA: Diagnosis not present

## 2018-09-04 DIAGNOSIS — L309 Dermatitis, unspecified: Secondary | ICD-10-CM

## 2018-09-04 DIAGNOSIS — M79676 Pain in unspecified toe(s): Secondary | ICD-10-CM | POA: Diagnosis not present

## 2018-09-04 DIAGNOSIS — D689 Coagulation defect, unspecified: Secondary | ICD-10-CM

## 2018-09-04 NOTE — Progress Notes (Signed)
Complaint:  Visit Type: Patient returns to my office for continued preventative foot care services. Complaint: Patient states" my nails have grown long and thick and become painful to walk and wear shoes" The patient presents for preventative foot care services. No changes to ROS.  Patient is taking eliquiss. Patient is with his daughter.  Podiatric Exam: Vascular: dorsalis pedis and posterior tibial pulses are palpable bilateral. Capillary return is immediate. Temperature gradient is WNL. Skin turgor WNL  Sensorium: Normal Semmes Weinstein monofilament test. Normal tactile sensation bilaterally. Nail Exam: Pt has thick disfigured discolored nails with subungual debris noted bilateral entire nail hallux . Ulcer Exam: There is no evidence of ulcer or pre-ulcerative changes or infection. Orthopedic Exam: Muscle tone and strength are WNL. No limitations in general ROM. No crepitus or effusions noted. Foot type and digits show no abnormalities. Bony prominences are unremarkable. Skin: No Porokeratosis. No infection or ulcers.  Red inflamed blotches both rorefeet.  Diagnosis:  Onychomycosis, , Pain in right toe, pain in left toes  Treatment & Plan Procedures and Treatment: Consent by patient was obtained for treatment procedures.   Debridement of mycotic and hypertrophic toenails, 1 through 5 bilateral and clearing of subungual debris. No ulceration, no infection noted. Use 1% hydrocortisone cream. Return Visit-Office Procedure: Patient instructed to return to the office for a follow up visit 3 months for continued evaluation and treatment.    Helane Gunther DPM

## 2018-09-08 ENCOUNTER — Encounter: Payer: Self-pay | Admitting: Adult Health

## 2018-09-08 ENCOUNTER — Non-Acute Institutional Stay (SKILLED_NURSING_FACILITY): Payer: Medicare Other | Admitting: Adult Health

## 2018-09-08 DIAGNOSIS — F0280 Dementia in other diseases classified elsewhere without behavioral disturbance: Secondary | ICD-10-CM

## 2018-09-08 DIAGNOSIS — G301 Alzheimer's disease with late onset: Secondary | ICD-10-CM

## 2018-09-08 DIAGNOSIS — F39 Unspecified mood [affective] disorder: Secondary | ICD-10-CM | POA: Diagnosis not present

## 2018-09-08 DIAGNOSIS — F329 Major depressive disorder, single episode, unspecified: Secondary | ICD-10-CM | POA: Diagnosis not present
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
## 2018-09-12 ENCOUNTER — Encounter
Admission: RE | Admit: 2018-09-12 | Discharge: 2018-09-12 | Disposition: A | Discharge status: Home / Self Care | Payer: Medicare Other | Source: Ambulatory Visit | Attending: Internal Medicine | Admitting: Internal Medicine

## 2018-09-19 ENCOUNTER — Encounter: Payer: Self-pay | Admitting: Adult Health

## 2018-09-19 ENCOUNTER — Non-Acute Institutional Stay (SKILLED_NURSING_FACILITY): Payer: Medicare Other | Admitting: Adult Health

## 2018-09-19 DIAGNOSIS — I10 Essential (primary) hypertension: Secondary | ICD-10-CM

## 2018-09-19 DIAGNOSIS — E441 Mild protein-calorie malnutrition: Secondary | ICD-10-CM

## 2018-09-19 DIAGNOSIS — I2699 Other pulmonary embolism without acute cor pulmonale: Secondary | ICD-10-CM

## 2018-09-19 DIAGNOSIS — M069 Rheumatoid arthritis, unspecified: Secondary | ICD-10-CM | POA: Diagnosis not present

## 2018-09-19 DIAGNOSIS — F329 Major depressive disorder, single episode, unspecified: Secondary | ICD-10-CM

## 2018-09-19 DIAGNOSIS — F0280 Dementia in other diseases classified elsewhere without behavioral disturbance: Secondary | ICD-10-CM

## 2018-09-19 NOTE — Progress Notes (Signed)
Provider:  Synthia Innocent, NP [facility]:  [facility] Room Number: 213 A Place of Service:  SNF (31)   PCP: System, Pcp Not In Patient Care Team: System, Pcp Not In as PCP - General  Extended Emergency Contact Information Primary Emergency Contact: Caviness,Carol Address: [address]          [place], [place] [address] [place] Home Phone: [phone] Relation: Other  Code Status: DNR Goals of Care: Advanced Directive information Advanced Directives 09/19/2018  Does Patient Have a Medical Advance Directive? Yes  Type of Advance Directive Out of facility DNR (pink MOST or yellow form)  Does patient want to make changes to medical advance directive? No - Patient declined  Copy of Healthcare Power of Attorney in Chart? -  Pre-existing out of facility DNR order (yellow form or pink MOST form) Yellow form placed in chart (order not valid for inpatient use)      Allergies  Allergen Reactions  . Morphine Sulfate   . Penicillins Other (See Comments)    Family thinks pt is allergic to penicillin but not sure      Chief Complaint  Patient presents with  . Annual Exam        HPI: Patient is a 84 y.o. male seen today for an annual comprehensive examination. He has remained stable over the past year. He has not had any hospitalizations. He denies any uncontrolled joint pain; no changes in appetite; no anxiety; no headaches. There are no nursing concerns at this time.   Past Medical History:  Diagnosis Date  . Alzheimer's dementia (HCC)   . 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 HTN, goal below 150/90 02/22/2017   Losartan  . Hypertension   . Major depression in remission (HCC) 02/22/2017  . Pulmonary embolism without acute cor pulmonale (HCC) 02/22/2017  . RA (rheumatoid arthritis) (HCC)    Past Surgical History:  Procedure Laterality Date  . JOINT REPLACEMENT     left hip replacement 09-19-16    reports that he  has never smoked. He has never used smokeless tobacco. He reports that he does not drink alcohol or use drugs. Social History   Socioeconomic History  . Marital status: Widowed    Spouse name: Not on file  . Number of children: 4  . Years of education: 35  . Highest education level: High school graduate  Occupational History  . Not on file  Social Needs  . Financial resource strain: Not on file  . Food insecurity:    Worry: Not on file    Inability: Not on file  . Transportation needs:    Medical: Not on file    Non-medical: Not on file  Tobacco Use  . Smoking status: Never Smoker  . Smokeless tobacco: Never Used  Substance and Sexual Activity  . Alcohol use: No  . Drug use: No  . Sexual activity: Never  Lifestyle  . Physical activity:    Days per week: Not on file    Minutes per session: Not on file  . Stress: Not on file  Relationships  . Social connections:    Talks on phone: Not on file    Gets together: Not on file    Attends religious service: Not on file    Active member of club or organization: Not on file    Attends meetings of clubs or organizations: Not on file    Relationship status: Not on file  .  Intimate partner violence:    Fear of current or ex partner: Not on file    Emotionally abused: Not on file    Physically abused: Not on file    Forced sexual activity: Not on file  Other Topics Concern  . Not on file  Social History Narrative   Admitted to [facility] 02/01/2017   Widowed   4 children   Never smoker   Denies alcohol    DNR   Family History  Problem Relation Age of Onset  . Other Father        On coumadin    Vitals:   09/19/18 1013  BP: 132/67  Pulse: 69  Resp: 16  Temp: 98.4 F (36.9 C)  SpO2: 99%  Weight: 225 lb (102.1 kg)  Height: 5\' 7"  (1.702 m)   Body mass index is 35.24 kg/m.  Allergies as of 09/19/2018      Reactions   Morphine Sulfate    Penicillins Other (See Comments)   Family thinks pt is allergic  to penicillin but not sure       Medication List        Accurate as of 09/19/18 10:27 AM. Always use your most recent med list.          acetaminophen 325 MG tablet Commonly known as:  TYLENOL Take 650 mg by mouth 4 (four) times daily as needed. 8 am, 1 pm, 5 pm, 9 pm   BISACODYL LAXATIVE 10 MG suppository Generic drug:  bisacodyl Place 10 mg rectally daily as needed for moderate constipation.   calcium-vitamin D 500-200 MG-UNIT tablet Commonly known as:  OSCAL WITH D Take 1 tablet by mouth 2 (two) times daily.   citalopram 10 MG tablet Commonly known as:  CELEXA Take 5 mg by mouth daily.   divalproex 125 MG capsule Commonly known as:  DEPAKOTE SPRINKLE Take 250 mg by mouth 2 (two) times daily.   docusate sodium 100 MG capsule Commonly known as:  COLACE Take 100 mg by mouth daily as needed for mild constipation.   donepezil 10 MG tablet Commonly known as:  ARICEPT Take 10 mg by mouth daily.   ELIQUIS 5 MG Tabs tablet Generic drug:  apixaban Take 5 mg by mouth 2 (two) times daily.   ENSURE ENLIVE PO Take 1 Bottle by mouth daily.   folic acid 1 MG tablet Commonly known as:  FOLVITE Take 1 mg by mouth daily.   hydrocortisone cream 1 % Apply 1 application topically daily as needed. For red flaky patches on face   hydrocortisone cream 1 % Apply 1 application topically 2 (two) times daily. for red flaky patches to BUE. Use cream until resolved, then may dc order   ketoconazole 2 % cream Commonly known as:  NIZORAL Apply 1 application topically 3 (three) times a week. Once A Day on Mon, Wed, Fri    Apply thin film to affected areas of face for seborrheic dermatitis   magnesium hydroxide 400 MG/5ML suspension Commonly known as:  MILK OF MAGNESIA Give 30 ml by mouth every 4 hours as needed for no BM for 2 days   methotrexate 2.5 MG tablet Commonly known as:  RHEUMATREX Take 2.5 mg by mouth once a week. Caution:Chemotherapy. Protect from light.  Pt takes on  Tuesday evenings... Was changed by MD- per pts daughter   NON FORMULARY Diet Type: Regular/ Chopped Meats   PREVIDENT 5000 DRY MOUTH 1.1 % Gel dental gel Generic drug:  sodium fluoride  Please brush regularly with prescribed toothpaste only per Dr. Eartha Inch DDS instruction on 7/10 during office visit. May keep in room   vitamin B-12 500 MCG tablet Commonly known as:  CYANOCOBALAMIN Take 500 mcg by mouth daily.        SIGNIFICANT DIAGNOSTIC EXAMS  LABS REVIEWED TODAY:   05-23-18: wbc 6.5; hgb 15.6; hct 46.4; mcv 84.9; plt 169; glucose 111; bun 16; creat 0.87; k+ 3.8; na++ 138; ca 8.9; liver normal albumin 3.8 chol 174; ldl 110; trig 124; hdl 39; mag 2.1; tsh 4.508; vit B 12: 1191; vit D 41.2  Review of Systems  Unable to perform ROS: Dementia (confusion )   Physical Exam  Constitutional: He appears well-developed and well-nourished. No distress.  Neck: No thyromegaly present.  Cardiovascular: Normal rate, regular rhythm, normal heart sounds and intact distal pulses.  Pulmonary/Chest: Effort normal and breath sounds normal. No respiratory distress.  Abdominal: Soft. Bowel sounds are normal. He exhibits no distension. There is no tenderness.  Musculoskeletal: He exhibits no edema.  Is able to move all extremities   Lymphadenopathy:    He has no cervical adenopathy.  Neurological: He is alert.  Skin: Skin is warm and dry. He is not diaphoretic.  Psychiatric: He has a normal mood and affect.     Assessment/Plan  TODAY:   1. Essential hypertension; benign: 132/67 will monitor   2.  Pulmonary embolism without acute cor pulmonale: is stable will continue eliquis 5 mg twice daily   3. Dementia associated with other underlying disease without behavioral disturbance: is without change weight is 179 pounds; will continue aricept 10 mg daily   4.  Arthritis, rheumatoid, bilateral hands: is stable will continue folic acid 1 mg daily and methotrexate 2.5 mg weekly is  taking tylenol 650 mg four times daily   5. Major depression, single episode: is emotionally stable; will continue celexa 5 mg daily   6.  Seizure like activity: is stable no reports of seizure activity present. Will continue depakote 250 mg twice daily   7. Mild protein -calorie malnutrition: is stable albumin is 3.8 weight is 179 pounds will monitor   Will stop vit B 12 Will check cbc; cmp Health maintenance is up to date       MD is aware of resident's narcotic use and is in agreement with current plan of care. We will wean dosage as appropriate for resident   Synthia Innocent NP [facility]  Contact [phone] Monday through Friday 8am- 5pm  After hours call [phone]

## 2018-09-20 ENCOUNTER — Other Ambulatory Visit
Admission: RE | Admit: 2018-09-20 | Discharge: 2018-09-20 | Disposition: A | Discharge status: Home / Self Care | Payer: Medicare Other | Source: Ambulatory Visit | Attending: Adult Health | Admitting: Adult Health

## 2018-09-20 DIAGNOSIS — I2782 Chronic pulmonary embolism: Secondary | ICD-10-CM | POA: Insufficient documentation

## 2018-09-20 DIAGNOSIS — M069 Rheumatoid arthritis, unspecified: Secondary | ICD-10-CM | POA: Insufficient documentation

## 2018-09-20 LAB — CBC WITH DIFFERENTIAL/PLATELET
ABS IMMATURE GRANULOCYTES: 0.03 10*3/uL (ref 0.00–0.07)
BASOS PCT: 0 %
Basophils Absolute: 0 10*3/uL (ref 0.0–0.1)
Eosinophils Absolute: 0.1 10*3/uL (ref 0.0–0.5)
Eosinophils Relative: 2 %
HCT: 41.3 % (ref 39.0–52.0)
HEMOGLOBIN: 13.6 g/dL (ref 13.0–17.0)
Immature Granulocytes: 1 %
LYMPHS PCT: 23 %
Lymphs Abs: 1.3 10*3/uL (ref 0.7–4.0)
MCH: 27.9 pg (ref 26.0–34.0)
MCHC: 32.9 g/dL (ref 30.0–36.0)
MCV: 84.8 fL (ref 80.0–100.0)
MONO ABS: 0.6 10*3/uL (ref 0.1–1.0)
Monocytes Relative: 11 %
Neutro Abs: 3.6 10*3/uL (ref 1.7–7.7)
Neutrophils Relative %: 63 %
PLATELETS: 181 10*3/uL (ref 150–400)
RBC: 4.87 MIL/uL (ref 4.22–5.81)
RDW: 13.6 % (ref 11.5–15.5)
WBC: 5.7 10*3/uL (ref 4.0–10.5)
nRBC: 0 % (ref 0.0–0.2)

## 2018-09-20 LAB — COMPREHENSIVE METABOLIC PANEL
ALK PHOS: 67 U/L (ref 38–126)
ALT: 27 U/L (ref 0–44)
AST: 19 U/L (ref 15–41)
Albumin: 2.7 g/dL — ABNORMAL LOW (ref 3.5–5.0)
Anion gap: 6 (ref 5–15)
BUN: 18 mg/dL (ref 8–23)
CALCIUM: 8.2 mg/dL — AB (ref 8.9–10.3)
CHLORIDE: 109 mmol/L (ref 98–111)
CO2: 27 mmol/L (ref 22–32)
CREATININE: 0.93 mg/dL (ref 0.61–1.24)
GFR calc non Af Amer: >60 mL/min (ref >60)
GLUCOSE: 107 mg/dL — AB (ref 70–99)
Potassium: 4.2 mmol/L (ref 3.5–5.1)
SODIUM: 142 mmol/L (ref 135–145)
Total Bilirubin: 0.7 mg/dL (ref 0.3–1.2)
Total Protein: 5.8 g/dL — ABNORMAL LOW (ref 6.5–8.1)

## 2018-09-24 DIAGNOSIS — I10 Essential (primary) hypertension: Secondary | ICD-10-CM | POA: Insufficient documentation

## 2018-10-17 ENCOUNTER — Encounter
Admission: RE | Admit: 2018-10-17 | Discharge: 2018-10-17 | Disposition: A | Discharge status: Home / Self Care | Payer: Medicare Other | Source: Ambulatory Visit | Attending: Internal Medicine | Admitting: Internal Medicine

## 2018-10-26 ENCOUNTER — Encounter: Payer: Self-pay | Admitting: Adult Health

## 2018-10-26 ENCOUNTER — Non-Acute Institutional Stay (SKILLED_NURSING_FACILITY): Payer: Medicare Other | Admitting: Adult Health

## 2018-10-26 DIAGNOSIS — M069 Rheumatoid arthritis, unspecified: Secondary | ICD-10-CM

## 2018-10-26 DIAGNOSIS — F0280 Dementia in other diseases classified elsewhere without behavioral disturbance: Secondary | ICD-10-CM

## 2018-10-26 DIAGNOSIS — H1032 Unspecified acute conjunctivitis, left eye: Secondary | ICD-10-CM

## 2018-10-26 DIAGNOSIS — I2699 Other pulmonary embolism without acute cor pulmonale: Secondary | ICD-10-CM | POA: Diagnosis not present

## 2018-10-26 DIAGNOSIS — I10 Essential (primary) hypertension: Secondary | ICD-10-CM | POA: Diagnosis not present

## 2018-10-26 NOTE — Progress Notes (Signed)
[facility]   [facility] Room Number: 213 A Place of Service:  SNF ([id number])   CODE STATUS: DNR  Allergies  Allergen Reactions  . Morphine Sulfate   . Penicillins Other (See Comments)    Family thinks pt is allergic to penicillin but not sure     Chief Complaint  Patient presents with  . Medical Management of Chronic Issues    Essential hypertension benign; other pulmonary embolism without acute cor pulmonale unspecified chronicity; dementia associated with other underlying disease without behavioral disturbance; rheumatoid arthritis involving both hands unspecified rheumatoid factor.     HPI:  He is a 84 year old long term resident of this facility being seen for the management of his chronic illnesses: hypertension; pulmonary embolism; dementia rheumatoid arthritis. He is unable to participate in the hpi or ros. There are no reports of agitation or anxiety; no uncontrolled pain. His left is red and inflamed.   Past Medical History:  Diagnosis Date  . Alzheimer's dementia (HCC)   . 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 HTN, goal below 150/90 02/22/2017   Losartan  . Hypertension   . Major depression in remission (HCC) 02/22/2017  . Pulmonary embolism without acute cor pulmonale (HCC) 02/22/2017  . RA (rheumatoid arthritis) (HCC)     Past Surgical History:  Procedure Laterality Date  . JOINT REPLACEMENT     left hip replacement 09-19-16    Social History   Socioeconomic History  . Marital status: Widowed    Spouse name: Not on file  . Number of children: 4  . Years of education: 63  . Highest education level: High school graduate  Occupational History  . Not on file  Social Needs  . Financial resource strain: Not on file  . Food insecurity:    Worry: Not on file    Inability: Not on file  . Transportation needs:    Medical: Not on file    Non-medical: Not on file  Tobacco Use  . Smoking status: Never Smoker    . Smokeless tobacco: Never Used  Substance and Sexual Activity  . Alcohol use: No  . Drug use: No  . Sexual activity: Never  Lifestyle  . Physical activity:    Days per week: Not on file    Minutes per session: Not on file  . Stress: Not on file  Relationships  . Social connections:    Talks on phone: Not on file    Gets together: Not on file    Attends religious service: Not on file    Active member of club or organization: Not on file    Attends meetings of clubs or organizations: Not on file    Relationship status: Not on file  . Intimate partner violence:    Fear of current or ex partner: Not on file    Emotionally abused: Not on file    Physically abused: Not on file    Forced sexual activity: Not on file  Other Topics Concern  . Not on file  Social History Narrative   Admitted to [facility] [place] 02/01/2017   Widowed   4 children   Never smoker   Denies alcohol    DNR   Family History  Problem Relation Age of Onset  . Other Father        On coumadin      VITAL SIGNS BP 128/64   Pulse 70   Temp (!) 97.4 F (  36.3 C)   Resp 18   Ht 5\' 7"  (1.702 m)   Wt 173 lb 4.8 oz (78.6 kg)   SpO2 98%   BMI 27.14 kg/m   Outpatient Encounter Medications as of 10/26/2018  Medication Sig  . acetaminophen (TYLENOL) 325 MG tablet Take 650 mg by mouth 4 (four) times daily as needed. 8 am, 1 pm, 5 pm, 9 pm   . Amino Acids-Protein Hydrolys (FEEDING SUPPLEMENT, PRO-STAT SUGAR FREE 64,) LIQD Take 30 mLs by mouth 3 (three) times daily with meals.  Marland Kitchen apixaban (ELIQUIS) 5 MG TABS tablet Take 5 mg by mouth 2 (two) times daily.  . bisacodyl (BISACODYL LAXATIVE) 10 MG suppository Place 10 mg rectally daily as needed for moderate constipation.  . calcium-vitamin D (OSCAL WITH D) 500-200 MG-UNIT tablet Take 1 tablet by mouth 2 (two) times daily.  . citalopram (CELEXA) 10 MG tablet Take 5 mg by mouth daily.  . divalproex (DEPAKOTE SPRINKLE) 125 MG capsule Take 250 mg by mouth 2  (two) times daily.   Marland Kitchen donepezil (ARICEPT) 10 MG tablet Take 10 mg by mouth daily.   . folic acid (FOLVITE) 1 MG tablet Take 1 mg by mouth daily.  . hydrocortisone cream 1 % Apply 1 application topically daily as needed. For red flaky patches on face  . ketoconazole (NIZORAL) 2 % cream Apply 1 application topically 3 (three) times a week. Once A Day on Mon, Wed, Fri    Apply thin film to affected areas of face for seborrheic dermatitis  . magnesium hydroxide (MILK OF MAGNESIA) 400 MG/5ML suspension Give 30 ml by mouth every 4 hours as needed for no BM for 2 days  . methotrexate (RHEUMATREX) 2.5 MG tablet Take 2.5 mg by mouth once a week. Caution:Chemotherapy. Protect from light.  Pt takes on Tuesday evenings... Was changed by MD- per pts daughter  . NON FORMULARY Diet Type: Regular/ Chopped Meats  . Nutritional Supplements (ENSURE ENLIVE PO) Take 1 Bottle by mouth daily.   . sodium fluoride (PREVIDENT 5000 DRY MOUTH) 1.1 % GEL dental gel Please brush regularly with prescribed toothpaste only per Dr. Eartha Inch DDS instruction on 7/10 during office visit. May keep in room  . [DISCONTINUED] cyanocobalamin 500 MCG tablet Take 500 mcg by mouth daily.  . [DISCONTINUED] docusate sodium (COLACE) 100 MG capsule Take 100 mg by mouth daily as needed for mild constipation.  . [DISCONTINUED] hydrocortisone cream 1 % Apply 1 application topically 2 (two) times daily. for red flaky patches to BUE. Use cream until resolved, then may dc order   No facility-administered encounter medications on file as of 10/26/2018.      SIGNIFICANT DIAGNOSTIC EXAMS  LABS REVIEWED PREVIOUS:   05-23-18: wbc 6.5; hgb 15.6; hct 46.4; mcv 84.9; plt 169; glucose 111; bun 16; creat 0.87; k+ 3.8; na++ 138; ca 8.9; liver normal albumin 3.8 chol 174; ldl 110; trig 124; hdl 39; mag 2.1; tsh 4.508; vit B 12: 1191; vit D 41.2  LABS REVIEWED TODAY:   09-20-18: wbc 5.7; hgb 13.6; hct 41.3; mcv 84.8; plt 181; glucose 107; bun  18; creat 0.93; k+ 4.2; na++ 142; ca 8.2; liver normal albumin 2.7    Review of Systems  Unable to perform ROS: Dementia (confusion )     Physical Exam  Constitutional: He appears well-developed and well-nourished. No distress.  Eyes:  Left eye sclera is red inflamed with purulent drainage present.   Neck: No thyromegaly present.  Cardiovascular: Normal rate, regular rhythm,  normal heart sounds and intact distal pulses.  Pulmonary/Chest: Effort normal and breath sounds normal. No respiratory distress.  Abdominal: Soft. Bowel sounds are normal. He exhibits no distension. There is no tenderness.  Musculoskeletal: He exhibits no edema.  Is able to move all extremities   Lymphadenopathy:    He has no cervical adenopathy.  Neurological: He is alert.  Skin: Skin is warm and dry. He is not diaphoretic.  Psychiatric: He has a normal mood and affect.  .   ASSESSMENT/PLAN   TODAY:   1. Essential hypertension; benign: 132/67 will monitor   2.  Pulmonary embolism without acute cor pulmonale: is stable will continue eliquis 5 mg twice daily   3. Dementia associated with other underlying disease without behavioral disturbance: is without change weight is 173 (previous 179) pounds; will continue aricept 10 mg daily   4.  Arthritis, rheumatoid, bilateral hands: is stable will continue folic acid 1 mg daily and methotrexate 2.5 mg weekly is taking tylenol 650 mg four times daily   5. Left conjunctivitis: is worse will begin cipro ophthalmologic 1 drop both eyes four times daily through 11-06-18. Will monitor     PREVIOUS   6. Major depression, single episode: is emotionally stable; will continue celexa 5 mg daily   7.  Seizure like activity: is stable no reports of seizure activity present. Will continue depakote 250 mg twice daily   8. Mild protein -calorie malnutrition: is stable albumin is 3.8 weight is 173 (previous179) pounds will supplements as directed      MD is aware of  resident's narcotic use and is in agreement with current plan of care. We will attempt to wean resident as apropriate   Synthia Innocent NP [facility]  Contact [phone] Monday through Friday 8am- 5pm  After hours call [phone]

## 2018-10-30 DIAGNOSIS — H109 Unspecified conjunctivitis: Secondary | ICD-10-CM | POA: Insufficient documentation

## 2018-11-01 ENCOUNTER — Encounter: Payer: Self-pay | Admitting: Adult Health

## 2018-11-01 ENCOUNTER — Non-Acute Institutional Stay (SKILLED_NURSING_FACILITY): Payer: Medicare Other | Admitting: Adult Health

## 2018-11-01 DIAGNOSIS — E441 Mild protein-calorie malnutrition: Secondary | ICD-10-CM

## 2018-11-01 DIAGNOSIS — F0390 Unspecified dementia without behavioral disturbance: Secondary | ICD-10-CM

## 2018-11-01 DIAGNOSIS — I10 Essential (primary) hypertension: Secondary | ICD-10-CM

## 2018-11-01 NOTE — Progress Notes (Signed)
[facility]   [facility] Room Number: 225 A Place of Service:  SNF (31)   CODE STATUS: DNR  Allergies  Allergen Reactions  . Morphine Sulfate   . Penicillins Other (See Comments)    Family thinks pt is allergic to penicillin but not sure     Chief Complaint  Patient presents with  . Acute Visit    Care Plan Meeting    HPI:  We have come together for his care plan meeting. His daughter is present. He is unable to participate in the hpi or ros. His weight has been stable he has had 4 falls without injury. He had been involved with the PACE program in [facility] and is interested in him participating in in the program once again. Social worker will look into this option. There are reports of uncontrolled pain; no agitation; no changes in behaviors.  We have discussed his advanced directives. He is a DNR.  I have given his daughter a MOST form for her to fill out with her siblings. We did review this form and her questions were answered; did verbalized understanding.    Past Medical History:  Diagnosis Date  . Alzheimer's dementia (HCC)   . 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 HTN, goal below 150/90 02/22/2017   Losartan  . Hypertension   . Major depression in remission (HCC) 02/22/2017  . Pulmonary embolism without acute cor pulmonale (HCC) 02/22/2017  . RA (rheumatoid arthritis) (HCC)     Past Surgical History:  Procedure Laterality Date  . JOINT REPLACEMENT     left hip replacement 09-19-16    Social History   Socioeconomic History  . Marital status: Widowed    Spouse name: Not on file  . Number of children: 4  . Years of education: 37  . Highest education level: High school graduate  Occupational History  . Not on file  Social Needs  . Financial resource strain: Not on file  . Food insecurity:    Worry: Not on file    Inability: Not on file  . Transportation needs:    Medical: Not on file   Non-medical: Not on file  Tobacco Use  . Smoking status: Never Smoker  . Smokeless tobacco: Never Used  Substance and Sexual Activity  . Alcohol use: No  . Drug use: No  . Sexual activity: Never  Lifestyle  . Physical activity:    Days per week: Not on file    Minutes per session: Not on file  . Stress: Not on file  Relationships  . Social connections:    Talks on phone: Not on file    Gets together: Not on file    Attends religious service: Not on file    Active member of club or organization: Not on file    Attends meetings of clubs or organizations: Not on file    Relationship status: Not on file  . Intimate partner violence:    Fear of current or ex partner: Not on file    Emotionally abused: Not on file    Physically abused: Not on file    Forced sexual activity: Not on file  Other Topics Concern  . Not on file  Social History Narrative   Admitted to [facility] [place] 02/01/2017   Widowed   4 children   Never smoker   Denies alcohol    DNR   Family History  Problem Relation Age of Onset  .  Other Father        On coumadin      VITAL SIGNS BP 136/80   Pulse 80   Temp (!) 97.5 F (36.4 C)   Resp 18   Ht 5\' 7"  (1.702 m)   Wt 173 lb 4.8 oz (78.6 kg)   SpO2 99%   BMI 27.14 kg/m   Outpatient Encounter Medications as of 11/01/2018  Medication Sig  . acetaminophen (TYLENOL) 325 MG tablet Take 650 mg by mouth 4 (four) times daily as needed. 8 am, 1 pm, 5 pm, 9 pm   . apixaban (ELIQUIS) 5 MG TABS tablet Take 5 mg by mouth 2 (two) times daily.  . bisacodyl (BISACODYL LAXATIVE) 10 MG suppository Place 10 mg rectally daily as needed for moderate constipation.  . calcium-vitamin D (OSCAL WITH D) 500-200 MG-UNIT tablet Take 1 tablet by mouth 2 (two) times daily.  . ciprofloxacin (CILOXAN) 0.3 % ophthalmic solution Place 1 drop into both eyes every 6 (six) hours. Starting 10/26/18, ending on 11/06/18  . citalopram (CELEXA) 10 MG tablet Take 5 mg by mouth daily.    . divalproex (DEPAKOTE SPRINKLE) 125 MG capsule Take 250 mg by mouth 2 (two) times daily.   Marland Kitchen docusate sodium (COLACE) 100 MG capsule Take 100 mg by mouth daily as needed for mild constipation.  Marland Kitchen donepezil (ARICEPT) 10 MG tablet Take 10 mg by mouth daily.   . folic acid (FOLVITE) 1 MG tablet Take 1 mg by mouth daily.  . hydrocortisone cream 1 % Apply 1 application topically daily as needed. For red flaky patches on face  . ketoconazole (NIZORAL) 2 % cream Apply 1 application topically 3 (three) times a week. Once A Day on Mon, Wed, Fri    Apply thin film to affected areas of face for seborrheic dermatitis  . magnesium hydroxide (MILK OF MAGNESIA) 400 MG/5ML suspension Give 30 ml by mouth every 4 hours as needed for no BM for 2 days  . methotrexate (RHEUMATREX) 2.5 MG tablet Take 2.5 mg by mouth once a week. Caution:Chemotherapy. Protect from light.  Pt takes on Tuesday evenings... Was changed by MD- per pts daughter  . NON FORMULARY Diet Type: Regular/ Chopped Meats  . Nutritional Supplements (ENSURE ENLIVE PO) Take 1 Bottle by mouth daily.   . sodium fluoride (PREVIDENT 5000 DRY MOUTH) 1.1 % GEL dental gel Please brush regularly with prescribed toothpaste only per Dr. Eartha Inch DDS instruction on 7/10 during office visit. May keep in room  . [DISCONTINUED] Amino Acids-Protein Hydrolys (FEEDING SUPPLEMENT, PRO-STAT SUGAR FREE 64,) LIQD Take 30 mLs by mouth 3 (three) times daily with meals.   No facility-administered encounter medications on file as of 11/01/2018.      SIGNIFICANT DIAGNOSTIC EXAMS  LABS REVIEWED PREVIOUS:   05-23-18: wbc 6.5; hgb 15.6; hct 46.4; mcv 84.9; plt 169; glucose 111; bun 16; creat 0.87; k+ 3.8; na++ 138; ca 8.9; liver normal albumin 3.8 chol 174; ldl 110; trig 124; hdl 39; mag 2.1; tsh 4.508; vit B 12: 1191; vit D 41.2 09-20-18: wbc 5.7; hgb 13.6; hct 41.3; mcv 84.8; plt 181; glucose 107; bun 18; creat 0.93; k+ 4.2; na++ 142; ca 8.2; liver normal albumin  2.7   NO NEW LABS.    Review of Systems  Unable to perform ROS: Dementia (confusion )    Physical Exam  Constitutional: He appears well-developed and well-nourished. No distress.  Neck: No thyromegaly present.  Cardiovascular: Normal rate, regular rhythm, normal heart sounds and  intact distal pulses.  Pulmonary/Chest: Effort normal and breath sounds normal. No respiratory distress.  Abdominal: Soft. Bowel sounds are normal. He exhibits no distension. There is no tenderness.  Musculoskeletal: He exhibits no edema.  Is able to move all extremities Uses wheelchair   Lymphadenopathy:    He has no cervical adenopathy.  Neurological: He is alert.  Skin: Skin is warm and dry. He is not diaphoretic.  Psychiatric: He has a normal mood and affect.      ASSESSMENT/PLAN  TODAY:  1. Dementia arising in the senium and presenium 2.  Mild protein calorie malnutrition  3. Essential hypertension, benign  Will continue his current plan of care Will continue his current medication regimen MOST form has been given to daughter; will review with other siblings; will fill out and return.    Time spent with patient and family: 45 minutes (time for advanced directives 25 minutes); discussed medications; care plan and goals of care. Verbalized understanding.    MD is aware of resident's narcotic use and is in agreement with current plan of care. We will attempt to wean resident as apropriate   Synthia Innocent NP [facility]  Contact [phone] Monday through Friday 8am- 5pm  After hours call [phone]

## 2018-11-04 NOTE — ACP (Advance Care Planning) (Signed)
I have spoken with his daughter regarding his advanced directives He is a DNR We did discuss the MOST form in details She will take from with her and will fill out with family and return from

## 2018-11-16 ENCOUNTER — Encounter
Admission: RE | Admit: 2018-11-16 | Discharge: 2018-11-16 | Disposition: A | Discharge status: Home / Self Care | Payer: Medicare Other | Source: Ambulatory Visit | Attending: Internal Medicine | Admitting: Internal Medicine

## 2018-11-24 ENCOUNTER — Encounter: Payer: Self-pay | Admitting: Adult Health

## 2018-11-24 ENCOUNTER — Non-Acute Institutional Stay (SKILLED_NURSING_FACILITY): Payer: Medicare Other | Admitting: Adult Health

## 2018-11-24 DIAGNOSIS — F324 Major depressive disorder, single episode, in partial remission: Secondary | ICD-10-CM

## 2018-11-24 DIAGNOSIS — L219 Seborrheic dermatitis, unspecified: Secondary | ICD-10-CM | POA: Insufficient documentation

## 2018-11-24 DIAGNOSIS — R569 Unspecified convulsions: Secondary | ICD-10-CM | POA: Diagnosis not present

## 2018-11-24 DIAGNOSIS — E441 Mild protein-calorie malnutrition: Secondary | ICD-10-CM | POA: Diagnosis not present

## 2018-11-24 NOTE — Progress Notes (Signed)
[facility]:   [facility] Room Number: 225 A Place of Service:  SNF (31)   CODE STATUS: DNR  Allergies  Allergen Reactions  . Morphine Sulfate   . Penicillins Other (See Comments)    Family thinks pt is allergic to penicillin but not sure     Chief Complaint  Patient presents with  . Medical Management of Chronic Issues    Seizure like activity; mild protein calorie malnutrition; major depressive disorder with single episode in partial remission.     HPI:  He is a 84 year old long term resident of this facility being seen for the management of his chronic illnesses: seizure like activity; malnutrition; depression. He is unable to participate in the hpi or ros. There are no reports of changes in appetite; no uncontrolled pain; no reports of agitation or anxiety. He is slowly losing weight.   Past Medical History:  Diagnosis Date  . Alzheimer's dementia (HCC)   . 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 HTN, goal below 150/90 02/22/2017   Losartan  . Hypertension   . Major depression in remission (HCC) 02/22/2017  . Pulmonary embolism without acute cor pulmonale (HCC) 02/22/2017  . RA (rheumatoid arthritis) (HCC)     Past Surgical History:  Procedure Laterality Date  . JOINT REPLACEMENT     left hip replacement 09-19-16    Social History   Socioeconomic History  . Marital status: Widowed    Spouse name: Not on file  . Number of children: 4  . Years of education: 70  . Highest education level: High school graduate  Occupational History  . Not on file  Social Needs  . Financial resource strain: Not on file  . Food insecurity:    Worry: Not on file    Inability: Not on file  . Transportation needs:    Medical: Not on file    Non-medical: Not on file  Tobacco Use  . Smoking status: Never Smoker  . Smokeless tobacco: Never Used  Substance and Sexual Activity  . Alcohol use: No  . Drug use: No  . Sexual  activity: Never  Lifestyle  . Physical activity:    Days per week: Not on file    Minutes per session: Not on file  . Stress: Not on file  Relationships  . Social connections:    Talks on phone: Not on file    Gets together: Not on file    Attends religious service: Not on file    Active member of club or organization: Not on file    Attends meetings of clubs or organizations: Not on file    Relationship status: Not on file  . Intimate partner violence:    Fear of current or ex partner: Not on file    Emotionally abused: Not on file    Physically abused: Not on file    Forced sexual activity: Not on file  Other Topics Concern  . Not on file  Social History Narrative   Admitted to [facility] [place] 02/01/2017   Widowed   4 children   Never smoker   Denies alcohol    DNR   Family History  Problem Relation Age of Onset  . Other Father        On coumadin      VITAL SIGNS BP 133/61   Pulse 70   Temp 98.6 F (37 C)   Resp 19   Ht 5\' 7"  (  1.702 m)   Wt 171 lb 1.6 oz (77.6 kg)   SpO2 99%   BMI 26.80 kg/m   Outpatient Encounter Medications as of 11/24/2018  Medication Sig  . acetaminophen (TYLENOL) 325 MG tablet Take 650 mg by mouth 4 (four) times daily as needed. 8 am, 1 pm, 5 pm, 9 pm   . apixaban (ELIQUIS) 5 MG TABS tablet Take 5 mg by mouth 2 (two) times daily.  . bisacodyl (BISACODYL LAXATIVE) 10 MG suppository Place 10 mg rectally daily as needed for moderate constipation.  . calcium-vitamin D (OSCAL WITH D) 500-200 MG-UNIT tablet Take 1 tablet by mouth 2 (two) times daily.  . citalopram (CELEXA) 10 MG tablet Take 5 mg by mouth daily.  . divalproex (DEPAKOTE SPRINKLE) 125 MG capsule Take 250 mg by mouth 2 (two) times daily.   Marland Kitchen docusate sodium (COLACE) 100 MG capsule Take 100 mg by mouth daily as needed for mild constipation.  Marland Kitchen donepezil (ARICEPT) 10 MG tablet Take 10 mg by mouth daily.   . folic acid (FOLVITE) 1 MG tablet Take 1 mg by mouth daily.  .  hydrocortisone cream 1 % Apply one application daily and as needed for red flaky patches on face  . ketoconazole (NIZORAL) 2 % cream Apply 1 application topically 3 (three) times a week. Once A Day on Mon, Wed, Fri    Apply thin film to affected areas of face for seborrheic dermatitis  . magnesium hydroxide (MILK OF MAGNESIA) 400 MG/5ML suspension Give 30 ml by mouth every 4 hours as needed for no BM for 2 days  . methotrexate (RHEUMATREX) 2.5 MG tablet Take 2.5 mg by mouth once a week. Caution:Chemotherapy. Protect from light.  Pt takes on Tuesday evenings... Was changed by MD- per pts daughter  . NON FORMULARY Diet Type: Regular/ Chopped Meats  . Nutritional Supplements (ENSURE ENLIVE PO) Take 1 Bottle by mouth daily.   . sodium fluoride (PREVIDENT 5000 DRY MOUTH) 1.1 % GEL dental gel Please brush regularly with prescribed toothpaste only per Dr. Eartha Inch DDS instruction on 7/10 during office visit. May keep in room   No facility-administered encounter medications on file as of 11/24/2018.      SIGNIFICANT DIAGNOSTIC EXAMS  LABS REVIEWED PREVIOUS:   05-23-18: wbc 6.5; hgb 15.6; hct 46.4; mcv 84.9; plt 169; glucose 111; bun 16; creat 0.87; k+ 3.8; na++ 138; ca 8.9; liver normal albumin 3.8 chol 174; ldl 110; trig 124; hdl 39; mag 2.1; tsh 4.508; vit B 12: 1191; vit D 41.2 09-20-18: wbc 5.7; hgb 13.6; hct 41.3; mcv 84.8; plt 181; glucose 107; bun 18; creat 0.93; k+ 4.2; na++ 142; ca 8.2; liver normal albumin 2.7   NO NEW LABS.   Review of Systems  Unable to perform ROS: Dementia (confusion)   Physical Exam Constitutional:      General: He is not in acute distress.    Appearance: Normal appearance. He is well-developed. He is not diaphoretic.  Neck:     Musculoskeletal: Normal range of motion and neck supple.     Thyroid: No thyromegaly.  Cardiovascular:     Rate and Rhythm: Normal rate and regular rhythm.     Heart sounds: Normal heart sounds.  Pulmonary:     Effort:  Pulmonary effort is normal. No respiratory distress.     Breath sounds: Normal breath sounds.  Abdominal:     General: Bowel sounds are normal. There is no distension.     Palpations: Abdomen is soft.  Tenderness: There is no abdominal tenderness.  Musculoskeletal:     Right lower leg: No edema.     Left lower leg: No edema.     Comments: Is able to move all extremities Uses wheelchair   Lymphadenopathy:     Cervical: No cervical adenopathy.  Skin:    General: Skin is warm and dry.  Neurological:     Mental Status: He is alert. Mental status is at baseline.  Psychiatric:        Mood and Affect: Mood normal.       ASSESSMENT/PLAN  TODAY:   1. Major depression, single episode: is emotionally stable; will continue celexa 5 mg daily   2.  Seizure like activity: is stable no reports of seizure activity present. Will continue depakote 250 mg twice daily   3. Mild protein -calorie malnutrition: is stable albumin is 3.8 weight is 171 (previous173) pounds will supplements as directed   PREVIOUS   4. Seborrheic dermatitis of scalp: is stable uses nizoril shampoo three times weekly.   5. Essential hypertension; benign: 133/61 will monitor   6.  Pulmonary embolism without acute cor pulmonale: is stable will continue eliquis 5 mg twice daily   7. Dementia associated with other underlying disease without behavioral disturbance: is without change weight is 171 (previous 173) pounds; will continue aricept 10 mg daily he is slowly loosing weight; this is an unfortunate outcome at the end stages of this disease   8.  Arthritis, rheumatoid, bilateral hands: is stable will continue folic acid 1 mg daily and methotrexate 2.5 mg weekly is taking tylenol 650 mg four times daily     MD is aware of resident's narcotic use and is in agreement with current plan of care. We will attempt to wean resident as apropriate   Synthia Innocent NP [facility]  Contact [phone] Monday  through Friday 8am- 5pm  After hours call [phone]

## 2018-12-04 ENCOUNTER — Ambulatory Visit: Payer: Medicare Other | Admitting: Podiatry

## 2018-12-14 ENCOUNTER — Encounter
Admission: RE | Admit: 2018-12-14 | Discharge: 2018-12-14 | Disposition: A | Discharge status: Home / Self Care | Payer: Medicare Other | Source: Ambulatory Visit | Attending: Internal Medicine | Admitting: Internal Medicine

## 2018-12-27 ENCOUNTER — Encounter: Payer: Self-pay | Admitting: Adult Health

## 2018-12-27 ENCOUNTER — Non-Acute Institutional Stay (SKILLED_NURSING_FACILITY): Payer: Medicare Other | Admitting: Adult Health

## 2018-12-27 DIAGNOSIS — I10 Essential (primary) hypertension: Secondary | ICD-10-CM

## 2018-12-27 DIAGNOSIS — I2699 Other pulmonary embolism without acute cor pulmonale: Secondary | ICD-10-CM | POA: Diagnosis not present

## 2018-12-27 DIAGNOSIS — F0390 Unspecified dementia without behavioral disturbance: Secondary | ICD-10-CM | POA: Diagnosis not present

## 2018-12-27 NOTE — Progress Notes (Signed)
[facility]:   [facility] Room Number: 225 A Place of Service:  SNF (31)   CODE STATUS: DNR  Allergies  Allergen Reactions  . Morphine Sulfate   . Penicillins Other (See Comments)    Family thinks pt is allergic to penicillin but not sure     Chief Complaint  Patient presents with  . Medical Management of Chronic Issues    Other pulmonary embolism without acute cor pulmonale unspecified chronicity; essential hypertension benign; dementia arising in the senium and presenium.     HPI:  He is a 85 year old long term resident of this facility being seen for the management of his chronic illnesses: PE; hypertension; dementia. There are no reports of anxiety or agitation; no uncontrolled pain.   Past Medical History:  Diagnosis Date  . Alzheimer's dementia (HCC)   . 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 HTN, goal below 150/90 02/22/2017   Losartan  . Hypertension   . Major depression in remission (HCC) 02/22/2017  . Pulmonary embolism without acute cor pulmonale (HCC) 02/22/2017  . RA (rheumatoid arthritis) (HCC)     Past Surgical History:  Procedure Laterality Date  . JOINT REPLACEMENT     left hip replacement 09-19-16    Social History   Socioeconomic History  . Marital status: Widowed    Spouse name: Not on file  . Number of children: 4  . Years of education: 6412  . Highest education level: High school graduate  Occupational History  . Not on file  Social Needs  . Financial resource strain: Not on file  . Food insecurity:    Worry: Not on file    Inability: Not on file  . Transportation needs:    Medical: Not on file    Non-medical: Not on file  Tobacco Use  . Smoking status: Never Smoker  . Smokeless tobacco: Never Used  Substance and Sexual Activity  . Alcohol use: No  . Drug use: No  . Sexual activity: Never  Lifestyle  . Physical activity:    Days per week: Not on file    Minutes per session: Not  on file  . Stress: Not on file  Relationships  . Social connections:    Talks on phone: Not on file    Gets together: Not on file    Attends religious service: Not on file    Active member of club or organization: Not on file    Attends meetings of clubs or organizations: Not on file    Relationship status: Not on file  . Intimate partner violence:    Fear of current or ex partner: Not on file    Emotionally abused: Not on file    Physically abused: Not on file    Forced sexual activity: Not on file  Other Topics Concern  . Not on file  Social History Narrative   [facility]   Widowed   4 children   Never smoker   Denies alcohol    DNR   Family History  Problem Relation Age of Onset  . Other Father        On coumadin      VITAL SIGNS BP (!) 152/97   Pulse (!) 58   Temp (!) 97 F (36.1 C)   Resp 18   Ht 5\' 7"  (1.702 m)   Wt 165 lb 9.6 oz (75.1 kg)   SpO2 96%  BMI 25.94 kg/m   Outpatient Encounter Medications as of 12/27/2018  Medication Sig  . acetaminophen (TYLENOL) 325 MG tablet Take 650 mg by mouth 4 (four) times daily as needed. 8 am, 1 pm, 5 pm, 9 pm   . apixaban (ELIQUIS) 5 MG TABS tablet Take 5 mg by mouth 2 (two) times daily.  . bisacodyl (BISACODYL LAXATIVE) 10 MG suppository Place 10 mg rectally daily as needed for moderate constipation.  . calcium-vitamin D (OSCAL WITH D) 500-200 MG-UNIT tablet Take 1 tablet by mouth 2 (two) times daily.  . citalopram (CELEXA) 10 MG tablet Take 5 mg by mouth daily.  . divalproex (DEPAKOTE SPRINKLE) 125 MG capsule Take 250 mg by mouth 2 (two) times daily.   Marland Kitchen docusate sodium (COLACE) 100 MG capsule Take 100 mg by mouth daily as needed for mild constipation.  Marland Kitchen donepezil (ARICEPT) 10 MG tablet Take 10 mg by mouth daily.   . folic acid (FOLVITE) 1 MG tablet Take 1 mg by mouth daily.  . hydrocortisone cream 1 % Apply one application daily and as needed for red flaky patches on face  .  ketoconazole (NIZORAL) 2 % cream Apply 1 application topically 3 (three) times a week. Once A Day on Mon, Wed, Fri    Apply thin film to affected areas of face for seborrheic dermatitis  . magnesium hydroxide (MILK OF MAGNESIA) 400 MG/5ML suspension Give 30 ml by mouth every 4 hours as needed for no BM for 2 days  . methotrexate (RHEUMATREX) 2.5 MG tablet Take 2.5 mg by mouth once a week. Caution:Chemotherapy. Protect from light.  Pt takes on Tuesday evenings... Was changed by MD- per pts daughter  . NON FORMULARY Diet Type: Regular/ Chopped Meats  . Nutritional Supplements (ENSURE ENLIVE PO) Take 1 Bottle by mouth daily.   . sodium fluoride (PREVIDENT 5000 DRY MOUTH) 1.1 % GEL dental gel Please brush regularly with prescribed toothpaste only per Dr. Eartha Inch DDS instruction on 7/10 during office visit. May keep in room   No facility-administered encounter medications on file as of 12/27/2018.      SIGNIFICANT DIAGNOSTIC EXAMS  LABS REVIEWED PREVIOUS:   05-23-18: wbc 6.5; hgb 15.6; hct 46.4; mcv 84.9; plt 169; glucose 111; bun 16; creat 0.87; k+ 3.8; na++ 138; ca 8.9; liver normal albumin 3.8 chol 174; ldl 110; trig 124; hdl 39; mag 2.1; tsh 4.508; vit B 12: 1191; vit D 41.2 09-20-18: wbc 5.7; hgb 13.6; hct 41.3; mcv 84.8; plt 181; glucose 107; bun 18; creat 0.93; k+ 4.2; na++ 142; ca 8.2; liver normal albumin 2.7   NO NEW LABS.    Review of Systems  Unable to perform ROS: Dementia (unable to participate )    Physical Exam Constitutional:      General: He is not in acute distress.    Appearance: He is well-developed. He is not diaphoretic.  Neck:     Thyroid: No thyromegaly.  Cardiovascular:     Rate and Rhythm: Normal rate and regular rhythm.     Pulses: Normal pulses.     Heart sounds: Normal heart sounds.  Pulmonary:     Effort: Pulmonary effort is normal. No respiratory distress.     Breath sounds: Normal breath sounds.  Abdominal:     General: Bowel sounds are  normal. There is no distension.     Palpations: Abdomen is soft.     Tenderness: There is no abdominal tenderness.  Musculoskeletal:     Right lower leg:  No edema.     Left lower leg: No edema.     Comments: Is able to move all extremities Uses wheelchair   Lymphadenopathy:     Cervical: No cervical adenopathy.  Skin:    General: Skin is warm and dry.  Neurological:     Mental Status: He is alert. Mental status is at baseline.  Psychiatric:        Mood and Affect: Mood normal.       ASSESSMENT/PLAN  TODAY:   1. Essential hypertension; benign: 152/97 will monitor   2.  Pulmonary embolism without acute cor pulmonale: is stable will continue eliquis 5 mg twice daily   3. Dementia associated with other underlying disease without behavioral disturbance: is without change weight is 165 (previous 171) pounds; will continue aricept 10 mg daily he is slowly loosing weight; this is an unfortunate outcome at the end stages of this disease    PREVIOUS   4. Seborrheic dermatitis of scalp: is stable uses nizoril shampoo three times weekly.   5.  Arthritis, rheumatoid, bilateral hands: is stable will continue folic acid 1 mg daily and methotrexate 2.5 mg weekly is taking tylenol 650 mg four times daily   6. Major depression, single episode: is emotionally stable; will continue celexa 5 mg daily   7.  Seizure like activity: is stable no reports of seizure activity present. Will continue depakote 250 mg twice daily   8. Mild protein -calorie malnutrition: is stable albumin is 3.8 weight is 165 (previous171) pounds will supplements as directed    MD is aware of resident's narcotic use and is in agreement with current plan of care. We will attempt to wean resident as apropriate   Synthia Innocent NP [facility]  Contact [phone] Monday through Friday 8am- 5pm  After hours call [phone]

## 2019-01-01 ENCOUNTER — Ambulatory Visit (INDEPENDENT_AMBULATORY_CARE_PROVIDER_SITE_OTHER): Payer: Medicare Other | Admitting: Podiatry

## 2019-01-01 ENCOUNTER — Encounter: Payer: Self-pay | Admitting: Podiatry

## 2019-01-01 DIAGNOSIS — M79676 Pain in unspecified toe(s): Secondary | ICD-10-CM | POA: Diagnosis not present

## 2019-01-01 DIAGNOSIS — B351 Tinea unguium: Secondary | ICD-10-CM | POA: Diagnosis not present

## 2019-01-01 DIAGNOSIS — D689 Coagulation defect, unspecified: Secondary | ICD-10-CM | POA: Diagnosis not present

## 2019-01-01 NOTE — Progress Notes (Signed)
Complaint:  Visit Type: Patient returns to my office for continued preventative foot care services. Complaint: Patient states" my nails have grown long and thick and become painful to walk and wear shoes" The patient presents for preventative foot care services. No changes to ROS.  Patient is taking eliquiss. Patient is with his daughter.  Podiatric Exam: Vascular: dorsalis pedis and posterior tibial pulses are palpable bilateral. Capillary return is immediate. Temperature gradient is WNL. Skin turgor WNL  Sensorium: Normal Semmes Weinstein monofilament test. Normal tactile sensation bilaterally. Nail Exam: Pt has thick disfigured discolored nails with subungual debris noted bilateral entire nail hallux . Ulcer Exam: There is no evidence of ulcer or pre-ulcerative changes or infection. Orthopedic Exam: Muscle tone and strength are WNL. No limitations in general ROM. No crepitus or effusions noted. Foot type and digits show no abnormalities. Bony prominences are unremarkable. Skin: No Porokeratosis. No infection or ulcers.  Red inflamed blotches both rorefeet.  Diagnosis:  Onychomycosis, , Pain in right toe, pain in left toes  Treatment & Plan Procedures and Treatment: Consent by patient was obtained for treatment procedures.   Debridement of mycotic and hypertrophic toenails, 1 through 5 bilateral and clearing of subungual debris. No ulceration, no infection noted.  Return Visit-Office Procedure: Patient instructed to return to the office for a follow up visit 4 months for continued evaluation and treatment.    Helane Gunther DPM

## 2019-01-12 ENCOUNTER — Other Ambulatory Visit
Admission: RE | Admit: 2019-01-12 | Discharge: 2019-01-12 | Disposition: A | Discharge status: Home / Self Care | Payer: Medicare Other | Source: Ambulatory Visit | Attending: Adult Health | Admitting: Adult Health

## 2019-01-12 DIAGNOSIS — F329 Major depressive disorder, single episode, unspecified: Secondary | ICD-10-CM | POA: Insufficient documentation

## 2019-01-12 LAB — VALPROIC ACID LEVEL: Valproic Acid Lvl: 20 ug/mL — ABNORMAL LOW (ref 50.0–100.0)

## 2019-01-13 ENCOUNTER — Encounter
Admission: RE | Admit: 2019-01-13 | Discharge: 2019-01-13 | Disposition: A | Discharge status: Home / Self Care | Payer: Medicare Other | Source: Ambulatory Visit | Attending: Internal Medicine | Admitting: Internal Medicine

## 2019-01-22 ENCOUNTER — Encounter: Payer: Self-pay | Admitting: Adult Health

## 2019-01-22 ENCOUNTER — Non-Acute Institutional Stay (SKILLED_NURSING_FACILITY): Payer: Medicare Other | Admitting: Adult Health

## 2019-01-22 DIAGNOSIS — R1311 Dysphagia, oral phase: Secondary | ICD-10-CM

## 2019-01-22 NOTE — Progress Notes (Signed)
Location:   [facility] Room Number: 225 A Place of Service:  SNF (31)   CODE STATUS: DNR  Allergies  Allergen Reactions  . Morphine Sulfate   . Penicillins Other (See Comments)    Family thinks pt is allergic to penicillin but not sure     Chief Complaint  Patient presents with  . Acute Visit    Coughing with liquids    HPI:  His family reports that he is choking with his meals. There are no reports of fevers or aspiration. There are no reports of coughing present. Staff reports that he is not coughing when eating his meals in the dining area. There are no reports of changes in appetite   Past Medical History:  Diagnosis Date  . Alzheimer's dementia (HCC)   . 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 HTN, goal below 150/90 02/22/2017   Losartan  . Hypertension   . Major depression in remission (HCC) 02/22/2017  . Pulmonary embolism without acute cor pulmonale (HCC) 02/22/2017  . RA (rheumatoid arthritis) (HCC)     Past Surgical History:  Procedure Laterality Date  . JOINT REPLACEMENT     left hip replacement 09-19-16    Social History   Socioeconomic History  . Marital status: Widowed    Spouse name: Not on file  . Number of children: 4  . Years of education: 60  . Highest education level: High school graduate  Occupational History  . Not on file  Social Needs  . Financial resource strain: Not on file  . Food insecurity:    Worry: Not on file    Inability: Not on file  . Transportation needs:    Medical: Not on file    Non-medical: Not on file  Tobacco Use  . Smoking status: Never Smoker  . Smokeless tobacco: Never Used  Substance and Sexual Activity  . Alcohol use: No  . Drug use: No  . Sexual activity: Never  Lifestyle  . Physical activity:    Days per week: Not on file    Minutes per session: Not on file  . Stress: Not on file  Relationships  . Social connections:    Talks on phone: Not on  file    Gets together: Not on file    Attends religious service: Not on file    Active member of club or organization: Not on file    Attends meetings of clubs or organizations: Not on file    Relationship status: Not on file  . Intimate partner violence:    Fear of current or ex partner: Not on file    Emotionally abused: Not on file    Physically abused: Not on file    Forced sexual activity: Not on file  Other Topics Concern  . Not on file  Social History Narrative   [facility] 02/01/2017   Widowed   4 children   Never smoker   Denies alcohol    DNR   Family History  Problem Relation Age of Onset  . Other Father        On coumadin      VITAL SIGNS BP 132/70   Pulse 70   Temp (!) 97.4 F (36.3 C)   Resp 18   Ht 5\' 7"  (1.702 m)   Wt 175 lb 1.6 oz (79.4 kg)   SpO2 100%   BMI 27.42 kg/m   Outpatient Encounter Medications as  of 01/22/2019  Medication Sig  . acetaminophen (TYLENOL) 325 MG tablet Take 650 mg by mouth 4 (four) times daily as needed. 8 am, 1 pm, 5 pm, 9 pm   . apixaban (ELIQUIS) 5 MG TABS tablet Take 5 mg by mouth 2 (two) times daily.  . bisacodyl (BISACODYL LAXATIVE) 10 MG suppository Place 10 mg rectally daily as needed for moderate constipation.  . calcium-vitamin D (OSCAL WITH D) 500-200 MG-UNIT tablet Take 1 tablet by mouth 2 (two) times daily.  . citalopram (CELEXA) 10 MG tablet Take 5 mg by mouth daily.  . divalproex (DEPAKOTE SPRINKLE) 125 MG capsule Take 250 mg by mouth 2 (two) times daily.   Marland Kitchen. docusate sodium (COLACE) 100 MG capsule Take 100 mg by mouth daily as needed for mild constipation.  Marland Kitchen. donepezil (ARICEPT) 10 MG tablet Take 10 mg by mouth daily.   . folic acid (FOLVITE) 1 MG tablet Take 1 mg by mouth daily.  . hydrocortisone cream 1 % Apply one application daily and as needed for red flaky patches on face  . ketoconazole (NIZORAL) 2 % cream Apply 1 application topically 3 (three) times a week. Once A Day on Mon,  Wed, Fri    Apply thin film to affected areas of face for seborrheic dermatitis  . magnesium hydroxide (MILK OF MAGNESIA) 400 MG/5ML suspension Give 30 ml by mouth every 4 hours as needed for no BM for 2 days  . methotrexate (RHEUMATREX) 2.5 MG tablet Take 2.5 mg by mouth once a week. Caution:Chemotherapy. Protect from light.  Pt takes on Tuesday evenings... Was changed by MD- per pts daughter  . NON FORMULARY Diet Type: Regular/ Chopped Meats  . Nutritional Supplements (ENSURE ENLIVE PO) Take 1 Bottle by mouth daily.   . sodium fluoride (PREVIDENT 5000 DRY MOUTH) 1.1 % GEL dental gel Please brush regularly with prescribed toothpaste only per Dr. Eartha InchLesli Hargis Fuller DDS instruction on 7/10 during office visit. May keep in room  . [DISCONTINUED] LORazepam (ATIVAN) 2 MG/ML injection    No facility-administered encounter medications on file as of 01/22/2019.      SIGNIFICANT DIAGNOSTIC EXAMS  LABS REVIEWED PREVIOUS:   05-23-18: wbc 6.5; hgb 15.6; hct 46.4; mcv 84.9; plt 169; glucose 111; bun 16; creat 0.87; k+ 3.8; na++ 138; ca 8.9; liver normal albumin 3.8 chol 174; ldl 110; trig 124; hdl 39; mag 2.1; tsh 4.508; vit B 12: 1191; vit D 41.2 09-20-18: wbc 5.7; hgb 13.6; hct 41.3; mcv 84.8; plt 181; glucose 107; bun 18; creat 0.93; k+ 4.2; na++ 142; ca 8.2; liver normal albumin 2.7   TODAY;   01-12-19: depakote 20    Review of Systems  Unable to perform ROS: Dementia (unable to participate)    Physical Exam Constitutional:      General: He is not in acute distress.    Appearance: He is well-developed. He is not diaphoretic.  Neck:     Musculoskeletal: Neck supple.     Thyroid: No thyromegaly.  Cardiovascular:     Rate and Rhythm: Normal rate and regular rhythm.     Pulses: Normal pulses.     Heart sounds: Normal heart sounds.  Pulmonary:     Effort: Pulmonary effort is normal. No respiratory distress.     Breath sounds: Normal breath sounds.  Abdominal:     General: Bowel sounds  are normal. There is no distension.     Palpations: Abdomen is soft.     Tenderness: There is no abdominal tenderness.  Musculoskeletal:     Right lower leg: No edema.     Left lower leg: No edema.     Comments: Is able to move all extremities Uses wheelchair   Lymphadenopathy:     Cervical: No cervical adenopathy.  Skin:    General: Skin is warm and dry.  Neurological:     Mental Status: He is alert. Mental status is at baseline.  Psychiatric:        Mood and Affect: Mood normal.      ASSESSMENT/PLAN  TODAY:   1. Oral phase dysphagia: is worse: family does not want speech therapy; they prefer testing to be done; will setup a modified barium swallow and will monitor     MD is aware of resident's narcotic use and is in agreement with current plan of care. We will attempt to wean resident as apropriate   Synthia Innocent NP [facility]  Contact [phone] Monday through Friday 8am- 5pm  After hours call [phone]

## 2019-01-24 ENCOUNTER — Other Ambulatory Visit: Payer: Self-pay | Admitting: Adult Health

## 2019-01-24 DIAGNOSIS — R1312 Dysphagia, oropharyngeal phase: Secondary | ICD-10-CM

## 2019-01-25 DIAGNOSIS — R1311 Dysphagia, oral phase: Secondary | ICD-10-CM | POA: Insufficient documentation

## 2019-02-01 ENCOUNTER — Non-Acute Institutional Stay (SKILLED_NURSING_FACILITY): Payer: Medicare Other | Admitting: Adult Health

## 2019-02-01 ENCOUNTER — Encounter: Payer: Self-pay | Admitting: Adult Health

## 2019-02-01 DIAGNOSIS — F324 Major depressive disorder, single episode, in partial remission: Secondary | ICD-10-CM | POA: Diagnosis not present

## 2019-02-01 DIAGNOSIS — I2782 Chronic pulmonary embolism: Secondary | ICD-10-CM

## 2019-02-01 DIAGNOSIS — M069 Rheumatoid arthritis, unspecified: Secondary | ICD-10-CM | POA: Diagnosis not present

## 2019-02-01 DIAGNOSIS — F39 Unspecified mood [affective] disorder: Secondary | ICD-10-CM

## 2019-02-01 DIAGNOSIS — F0390 Unspecified dementia without behavioral disturbance: Secondary | ICD-10-CM

## 2019-02-01 NOTE — Progress Notes (Signed)
Opened in error; Disregard.

## 2019-02-01 NOTE — Progress Notes (Signed)
[facility]  [facility] Room Number: 225A Place of Service:  SNF (31) Provider:  Kenard GowerMedina-Vargas, [place], NP  Patient Care Team: System, Pcp Not In as PCP - General  Extended Emergency Contact Information Primary Emergency Contact: Caviness,Carol Address: [address]          [place], [place] [address] [place] Home Phone: [phone] Relation: Other  Code Status:  DNR  Goals of care: Advanced Directive information Advanced Directives 02/01/2019  Does Patient Have a Medical Advance Directive? Yes  Type of Advance Directive Out of facility DNR (pink MOST or yellow form)  Does patient want to make changes to medical advance directive? No - Patient declined  Copy of Healthcare Power of Attorney in Chart? -  Pre-existing out of facility DNR order (yellow form or pink MOST form) Yellow form placed in chart (order not valid for inpatient use)     Chief Complaint  Patient presents with  . Medical Management of Chronic Issues    Routine Visit    HPI:  Pt is a 85 y.o. male seen today for medical management of chronic diseases. He has PMH of Alzheimer's dementia, hypertension, major depression, pulmonary embolism and rheumatoid arthritis. He was seen in the room today.  He was cooperative and smiling.  He repeats the question and says I do not know whenever he was asked regarding his orientation. He does not know his birthday. He is able to move his extremities without difficulty while sitting in his wheelchair.,    Past Medical History:  Diagnosis Date  . Alzheimer's dementia (HCC)   . 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 HTN, goal below 150/90 02/22/2017   Losartan  . Hypertension   . Major depression in remission (HCC) 02/22/2017  . Pulmonary embolism without acute cor pulmonale (HCC) 02/22/2017  . RA (rheumatoid arthritis) (HCC)    Past Surgical History:  Procedure Laterality Date  . JOINT REPLACEMENT     left hip replacement 09-19-16    Allergies  Allergen Reactions  . Morphine Sulfate   . Penicillins Other (See Comments)    Family thinks pt is allergic to penicillin but not sure     Outpatient Encounter Medications as of 02/01/2019  Medication Sig  . acetaminophen (TYLENOL) 325 MG tablet Take 650 mg by mouth 4 (four) times daily as needed. 8 am, 1 pm, 5 pm, 9 pm   . apixaban (ELIQUIS) 5 MG TABS tablet Take 5 mg by mouth 2 (two) times daily.  . bisacodyl (BISACODYL LAXATIVE) 10 MG suppository Place 10 mg rectally daily as needed for moderate constipation.  . calcium-vitamin D (OSCAL WITH D) 500-200 MG-UNIT tablet Take 1 tablet by mouth 2 (two) times daily.  . citalopram (CELEXA) 10 MG tablet Take 5 mg by mouth daily.  . divalproex (DEPAKOTE SPRINKLE) 125 MG capsule Take 250 mg by mouth 2 (two) times daily.   Marland Kitchen. docusate sodium (COLACE) 100 MG capsule Take 100 mg by mouth daily as needed for mild constipation.  Marland Kitchen. donepezil (ARICEPT) 10 MG tablet Take 10 mg by mouth daily.   . folic acid (FOLVITE) 1 MG tablet Take 1 mg by mouth daily.  . hydrocortisone cream 1 % Apply one application daily and as needed for red flaky patches on face  . ketoconazole (NIZORAL) 2 % cream Apply 1 application topically 3 (three) times a week. Once A Day on Mon, Wed, Fri    Apply thin film to  affected areas of face for seborrheic dermatitis  . magnesium hydroxide (MILK OF MAGNESIA) 400 MG/5ML suspension Give 30 ml by mouth every 4 hours as needed for no BM for 2 days  . methotrexate (RHEUMATREX) 2.5 MG tablet Take 2.5 mg by mouth once a week. Caution:Chemotherapy. Protect from light.  Pt takes on Tuesday evenings... Was changed by MD- per pts daughter  . NON FORMULARY Diet Type: Regular/ Chopped Meats  . Nutritional Supplements (ENSURE ENLIVE PO) Take 1 Bottle by mouth daily.   . sodium fluoride (PREVIDENT 5000 DRY MOUTH) 1.1 % GEL dental gel Please brush regularly with prescribed toothpaste only per Dr. Eartha Inch DDS instruction on 7/10 during office visit. May keep in room   No facility-administered encounter medications on file as of 02/01/2019.     Review of Systems  Unable to obtain due to dementia     Immunization History  Administered Date(s) Administered  . Influenza-Unspecified 08/13/2016, 08/31/2017, 10/11/2018  . PPD Test 02/01/2017, 01/13/2018, 01/13/2019  . Pneumococcal-Unspecified 08/13/2016   Pertinent  Health Maintenance Due  Topic Date Due  . PNA vac Low Risk Adult (2 of 2 - PCV13) 07/25/2024 (Originally 08/13/2017)  . INFLUENZA VACCINE  Completed   Fall Risk  11/04/2018  Falls in the past year? 1  Number falls in past yr: 1  Injury with Fall? 0  Risk for fall due to : History of fall(s);Impaired balance/gait;Impaired mobility;Impaired vision     Vitals:   02/01/19 0831  BP: 123/70  Pulse: 73  Resp: 18  Temp: (!) 97.5 F (36.4 C)  TempSrc: Oral  SpO2: 98%  Weight: 172 lb 12.8 oz (78.4 kg)  Height: 5\' 7"  (1.702 m)   Body mass index is 27.06 kg/m.  Physical Exam  GENERAL APPEARANCE: Well nourished. In no acute distress. Normal body habitus SKIN:  Skin is warm and dry.  MOUTH and THROAT: Lips are without lesions. Oral mucosa is moist and without lesions. Tongue is normal in shape, size, and color and without lesions RESPIRATORY: Breathing is even & unlabored, BS CTAB CARDIAC: RRR, no murmur,no extra heart sounds, no edema GI: Abdomen soft, normal BS, no masses EXTREMITIES:  Able to move X 4 extremities NEUROLOGICAL: There is no tremor. Speech is clear. Unable to answer any queries PSYCHIATRIC: . Affect and behavior are appropriate   Labs reviewed: Recent Labs    05/23/18 1330 09/20/18 0635  NA 138 142  K 3.8 4.2  CL 103 109  CO2 25 27  GLUCOSE 111* 107*  BUN 16 18  CREATININE 0.87 0.93  CALCIUM 8.9 8.2*  MG 2.1  --    Recent Labs    05/23/18 1330 09/20/18 0635  AST 22 19  ALT 26 27  ALKPHOS 64 67  BILITOT 0.4 0.7  PROT 7.4  5.8*  ALBUMIN 3.8 2.7*   Recent Labs    05/23/18 0403 05/23/18 1330 09/20/18 0635  WBC 5.1 6.5 5.7  NEUTROABS 3.1 4.2 3.6  HGB 13.7 15.6 13.6  HCT 40.9 46.4 41.3  MCV 83.7 84.9 84.8  PLT 152 169 181   Lab Results  Component Value Date   TSH 4.508 (H) 05/23/2018   No results found for: HGBA1C Lab Results  Component Value Date   CHOL 174 05/23/2018   HDL 39 (L) 05/23/2018   LDLCALC 110 (H) 05/23/2018   TRIG 124 05/23/2018   CHOLHDL 4.5 05/23/2018      Assessment/Plan  1. Other chronic pulmonary embolism, unspecified whether acute cor  pulmonale present (HCC) -No SOB, continue Eliquis 5 mg 1 tab twice a day  2. Rheumatoid arthritis involving both hands, unspecified rheumatoid factor presence (HCC) -Denies pain, stable, continue methotrexate 2.5 mg daily on Wednesdays  3. Major depressive disorder with single episode, in partial remission (HCC) -Mood is stable, continue citalopram 5 mg 1 tab daily  4. Mood disorder (HCC) -Stable, continue Depakote DR 125 mg daily 2 capsules = 250 milligrams twice a day  5. 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  Labs/tests ordered: CBC, BMP and lipid panel  Goals of care:   Long-term care   Kenard Gower, NP [facility] [phone] (Monday-Friday 8:00 a.m. - 5:00 p.m.) [phone] (after hours)

## 2019-02-05 ENCOUNTER — Non-Acute Institutional Stay (SKILLED_NURSING_FACILITY): Payer: Medicare Other | Admitting: Adult Health

## 2019-02-05 ENCOUNTER — Encounter: Payer: Self-pay | Admitting: Adult Health

## 2019-02-05 DIAGNOSIS — F0390 Unspecified dementia without behavioral disturbance: Secondary | ICD-10-CM

## 2019-02-05 DIAGNOSIS — F39 Unspecified mood [affective] disorder: Secondary | ICD-10-CM | POA: Diagnosis not present

## 2019-02-05 DIAGNOSIS — F324 Major depressive disorder, single episode, in partial remission: Secondary | ICD-10-CM

## 2019-02-05 DIAGNOSIS — I2782 Chronic pulmonary embolism: Secondary | ICD-10-CM

## 2019-02-05 DIAGNOSIS — M069 Rheumatoid arthritis, unspecified: Secondary | ICD-10-CM | POA: Diagnosis not present

## 2019-02-05 NOTE — Progress Notes (Signed)
Location:  [facility] Room Number: 225-A Place of Service:  SNF (31) Provider:  Kenard Gower, NP  Patient Care Team: System, Pcp Not In as PCP - General  Extended Emergency Contact Information Primary Emergency Contact: Caviness,Carol Address: [address]          [place], [place] [address] [place] Home Phone: [phone] Relation: Other  Code Status:  DNR  Goals of care: Advanced Directive information Advanced Directives 02/01/2019  Does Patient Have a Medical Advance Directive? Yes  Type of Advance Directive Out of facility DNR (pink MOST or yellow form)  Does patient want to make changes to medical advance directive? No - Patient declined  Copy of Healthcare Power of Attorney in Chart? -  Pre-existing out of facility DNR order (yellow form or pink MOST form) Yellow form placed in chart (order not valid for inpatient use)     Chief Complaint  Patient presents with  . Discharge Note    Patient is discharging to [facility] on 02/06/19    HPI:  Joseph [name] is an 85 y.o. male seen today for discharge.  He will transfer to [facility] in [place], [place] on 02/06/19.  He had been a long-term care resident of [facility].  He has a PMH of Alzheimer's dementia, hypertension, major depression, pulmonary embolism, and rheumatoid arthritis.    Past Medical History:  Diagnosis Date  . Alzheimer's dementia (HCC)   . 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 HTN, goal below 150/90 02/22/2017   Losartan  . Hypertension   . Major depression in remission (HCC) 02/22/2017  . Pulmonary embolism without acute cor pulmonale (HCC) 02/22/2017  . RA (rheumatoid arthritis) (HCC)    Past Surgical History:  Procedure Laterality Date  . JOINT REPLACEMENT     left hip replacement 09-19-16    Allergies  Allergen Reactions  . Morphine Sulfate   . Penicillins Other (See Comments)    Family thinks Joseph [name] is allergic to penicillin but  not sure     Outpatient Encounter Medications as of 02/05/2019  Medication Sig  . acetaminophen (TYLENOL) 325 MG tablet Take 650 mg by mouth 4 (four) times daily as needed. 8 am, 1 pm, 5 pm, 9 pm   . apixaban (ELIQUIS) 5 MG TABS tablet Take 5 mg by mouth 2 (two) times daily.  . bisacodyl (BISACODYL LAXATIVE) 10 MG suppository Place 10 mg rectally daily as needed for moderate constipation.  . calcium-vitamin D (OSCAL WITH D) 500-200 MG-UNIT tablet Take 1 tablet by mouth 2 (two) times daily.  . citalopram (CELEXA) 10 MG tablet Take 5 mg by mouth daily.  . divalproex (DEPAKOTE SPRINKLE) 125 MG capsule Take 250 mg by mouth 2 (two) times daily.   Marland Kitchen docusate sodium (COLACE) 100 MG capsule Take 100 mg by mouth daily as needed for mild constipation.  Marland Kitchen donepezil (ARICEPT) 10 MG tablet Take 10 mg by mouth daily.   . folic acid (FOLVITE) 1 MG tablet Take 1 mg by mouth daily.  . hydrocortisone cream 1 % Apply one application daily and as needed for red flaky patches on face  . ketoconazole (NIZORAL) 2 % cream Apply 1 application topically 3 (three) times a week. Once A Day on Mon, Wed, Fri    Apply thin film to affected areas of face for seborrheic dermatitis  . magnesium hydroxide (MILK OF MAGNESIA) 400 MG/5ML suspension Give 30 ml by mouth every 4 hours as  needed for no BM for 2 days  . methotrexate (RHEUMATREX) 2.5 MG tablet Take 2.5 mg by mouth once a week. Caution:Chemotherapy. Protect from light.  Joseph [name] takes on Tuesday evenings... Was changed by MD- per pts daughter  . NON FORMULARY Diet Type: Regular/ Chopped Meats  . Nutritional Supplements (ENSURE ENLIVE PO) Take 1 Bottle by mouth daily.   . sodium fluoride (PREVIDENT 5000 DRY MOUTH) 1.1 % GEL dental gel Please brush regularly with prescribed toothpaste only per Dr. Eartha InchLesli Hargis Fuller DDS instruction on 7/10 during office visit. May keep in room   No facility-administered encounter medications on file as of 02/05/2019.     Review of Systems   Unable to obtain due to dementia    Immunization History  Administered Date(s) Administered  . Influenza-Unspecified 08/13/2016, 08/31/2017, 10/11/2018  . PPD Test 02/01/2017, 01/13/2018, 01/13/2019  . Pneumococcal-Unspecified 08/13/2016   Pertinent  Health Maintenance Due  Topic Date Due  . PNA vac Low Risk Adult (2 of 2 - PCV13) 07/25/2024 (Originally 08/13/2017)  . INFLUENZA VACCINE  Completed   Fall Risk  11/04/2018  Falls in the past year? 1  Number falls in past yr: 1  Injury with Fall? 0  Risk for fall due to : History of fall(s);Impaired balance/gait;Impaired mobility;Impaired vision     Vitals:   02/05/19 1042  BP: 118/70  Pulse: 68  Resp: 18  Temp: 97.8 F (36.6 C)  TempSrc: Oral  SpO2: 100%  Weight: 172 lb 12.8 oz (78.4 kg)  Height: 5\' 7"  (1.702 m)   Body mass index is 27.06 kg/m.  Physical Exam  GENERAL APPEARANCE: Well nourished. In no acute distress. Normal body habitus SKIN:  Skin is warm and dry. MOUTH and THROAT: Lips are without lesions. Oral mucosa is moist and without lesions.  RESPIRATORY: Breathing is even & unlabored, BS CTAB CARDIAC: RRR, no murmur,no extra heart sounds, no edema GI: Abdomen soft, normal BS, no masses, no tenderness EXTREMITIES:  Able to move X 4 extremities NEUROLOGICAL: There is no tremor. Speech is clear. Disoriented X 3 PSYCHIATRIC:  Affect and behavior are appropriate   Labs reviewed: Recent Labs    05/23/18 1330 09/20/18 0635  NA 138 142  K 3.8 4.2  CL 103 109  CO2 25 27  GLUCOSE 111* 107*  BUN 16 18  CREATININE 0.87 0.93  CALCIUM 8.9 8.2*  MG 2.1  --    Recent Labs    05/23/18 1330 09/20/18 0635  AST 22 19  ALT 26 27  ALKPHOS 64 67  BILITOT 0.4 0.7  PROT 7.4 5.8*  ALBUMIN 3.8 2.7*   Recent Labs    05/23/18 0403 05/23/18 1330 09/20/18 0635  WBC 5.1 6.5 5.7  NEUTROABS 3.1 4.2 3.6  HGB 13.7 15.6 13.6  HCT 40.9 46.4 41.3  MCV 83.7 84.9 84.8  PLT 152 169 181   Lab Results  Component  Value Date   TSH 4.508 (H) 05/23/2018    Lab Results  Component Value Date   CHOL 174 05/23/2018   HDL 39 (L) 05/23/2018   LDLCALC 110 (H) 05/23/2018   TRIG 124 05/23/2018   CHOLHDL 4.5 05/23/2018    Assessment/Plan  1. Other chronic pulmonary embolism without acute cor pulmonale (HCC) - continue Eliquis 5 mg 1 tab twice a day  2. Rheumatoid arthritis involving both hands, unspecified rheumatoid factor presence (HCC) -Continue methotrexate 2.5 mg q. weekly on Wednesdays  3. Major depressive disorder with single episode, in partial remission (HCC) -Continue citalopram  5 mg 1 tab daily  4. Mood disorder (HCC) -Continue Depakote 125 mg 2 tabs = 250 mg twice a day  5. 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  Total discharge time: Greater than 30 minutes Greater than 50% was spent in coordination of care.  Discharge time involved coordination of the discharge process with Child psychotherapist and nursing staff.   Kenard Gower, NP [facility] [phone] (Monday-Friday 8:00 a.m. - 5:00 p.m.) [phone] (after hours)

## 2019-02-28 ENCOUNTER — Ambulatory Visit: Payer: Medicare Other

## 2019-04-30 ENCOUNTER — Ambulatory Visit: Payer: Medicare Other | Admitting: Podiatry

## 2019-09-13 DEATH — deceased
